# Patient Record
Sex: Male | Born: 1965 | Race: Black or African American | Hispanic: No | Marital: Married | Smoking: Never smoker
Health system: Southern US, Community
[De-identification: ages and names within clinical notes are randomized; demographics above are authoritative.]

## PROBLEM LIST (undated history)

## (undated) DIAGNOSIS — B1910 Unspecified viral hepatitis B without hepatic coma: Secondary | ICD-10-CM

## (undated) HISTORY — DX: Unspecified viral hepatitis B without hepatic coma: B19.10

---

## 1997-12-11 DIAGNOSIS — B1910 Unspecified viral hepatitis B without hepatic coma: Secondary | ICD-10-CM

## 1997-12-11 HISTORY — DX: Unspecified viral hepatitis B without hepatic coma: B19.10

## 2001-12-11 HISTORY — PX: HERNIA REPAIR: SHX51

## 2006-08-21 ENCOUNTER — Ambulatory Visit: Payer: Self-pay | Admitting: Sports Medicine

## 2006-08-29 ENCOUNTER — Ambulatory Visit: Payer: Self-pay | Admitting: Sports Medicine

## 2006-08-29 LAB — CONVERTED CEMR LAB
Creatinine, Ser: 1.06 mg/dL
HDL: 68 mg/dL
LDL Cholesterol: 145 mg/dL
LDL Cholesterol: 145 mg/dL
Potassium: 4.9 meq/L
Potassium: 4.9 meq/L

## 2006-09-24 ENCOUNTER — Ambulatory Visit: Payer: Self-pay | Admitting: Family Medicine

## 2006-10-17 ENCOUNTER — Encounter: Admission: RE | Admit: 2006-10-17 | Discharge: 2006-10-17 | Payer: Self-pay | Admitting: Gastroenterology

## 2006-10-24 ENCOUNTER — Ambulatory Visit: Payer: Self-pay | Admitting: Sports Medicine

## 2006-12-28 ENCOUNTER — Encounter: Admission: RE | Admit: 2006-12-28 | Discharge: 2006-12-28 | Payer: Self-pay | Admitting: Gastroenterology

## 2007-01-30 ENCOUNTER — Encounter (INDEPENDENT_AMBULATORY_CARE_PROVIDER_SITE_OTHER): Payer: Self-pay | Admitting: *Deleted

## 2007-02-07 DIAGNOSIS — B1910 Unspecified viral hepatitis B without hepatic coma: Secondary | ICD-10-CM | POA: Insufficient documentation

## 2008-05-18 ENCOUNTER — Ambulatory Visit: Payer: Self-pay | Admitting: Family Medicine

## 2008-05-18 ENCOUNTER — Encounter (INDEPENDENT_AMBULATORY_CARE_PROVIDER_SITE_OTHER): Payer: Self-pay | Admitting: *Deleted

## 2008-05-22 ENCOUNTER — Encounter (INDEPENDENT_AMBULATORY_CARE_PROVIDER_SITE_OTHER): Payer: Self-pay | Admitting: *Deleted

## 2008-05-22 LAB — CONVERTED CEMR LAB
AST: 17 units/L (ref 0–37)
Alkaline Phosphatase: 54 units/L (ref 39–117)
BUN: 15 mg/dL (ref 6–23)
Calcium: 9.9 mg/dL (ref 8.4–10.5)
Chloride: 104 meq/L (ref 96–112)
Creatinine, Ser: 1.15 mg/dL (ref 0.40–1.50)
HDL: 87 mg/dL (ref >39)
TSH: 1.1 microintl units/mL (ref 0.350–5.50)
Total Bilirubin: 1.1 mg/dL (ref 0.3–1.2)
Total CHOL/HDL Ratio: 2.2
VLDL: 7 mg/dL (ref 0–40)

## 2008-06-01 ENCOUNTER — Encounter (INDEPENDENT_AMBULATORY_CARE_PROVIDER_SITE_OTHER): Payer: Self-pay | Admitting: *Deleted

## 2008-06-01 ENCOUNTER — Ambulatory Visit (HOSPITAL_COMMUNITY): Admission: RE | Admit: 2008-06-01 | Discharge: 2008-06-01 | Payer: Self-pay | Admitting: *Deleted

## 2008-07-02 ENCOUNTER — Telehealth: Payer: Self-pay | Admitting: *Deleted

## 2008-07-02 ENCOUNTER — Ambulatory Visit: Payer: Self-pay | Admitting: Sports Medicine

## 2008-07-16 ENCOUNTER — Ambulatory Visit: Payer: Self-pay | Admitting: Family Medicine

## 2008-07-22 ENCOUNTER — Ambulatory Visit: Payer: Self-pay | Admitting: Family Medicine

## 2008-07-28 ENCOUNTER — Encounter: Payer: Self-pay | Admitting: *Deleted

## 2008-08-03 ENCOUNTER — Ambulatory Visit: Payer: Self-pay | Admitting: Sports Medicine

## 2008-08-04 ENCOUNTER — Telehealth (INDEPENDENT_AMBULATORY_CARE_PROVIDER_SITE_OTHER): Payer: Self-pay | Admitting: *Deleted

## 2009-10-21 ENCOUNTER — Ambulatory Visit: Payer: Self-pay | Admitting: Family Medicine

## 2009-10-21 ENCOUNTER — Encounter: Payer: Self-pay | Admitting: Sports Medicine

## 2009-10-25 ENCOUNTER — Ambulatory Visit (HOSPITAL_COMMUNITY): Admission: RE | Admit: 2009-10-25 | Discharge: 2009-10-25 | Payer: Self-pay | Admitting: Sports Medicine

## 2009-10-25 LAB — CONVERTED CEMR LAB
AFP-Tumor Marker: 7 ng/mL (ref 0.0–8.0)
ALT: 19 units/L (ref 0–53)
AST: 22 units/L (ref 0–37)
Albumin: 4.3 g/dL (ref 3.5–5.2)
Alkaline Phosphatase: 60 units/L (ref 39–117)
BUN: 14 mg/dL (ref 6–23)
CO2: 28 meq/L (ref 19–32)
Calcium: 9.8 mg/dL (ref 8.4–10.5)
Chloride: 104 meq/L (ref 96–112)
Creatinine, Ser: 1.18 mg/dL (ref 0.40–1.50)
Glucose, Bld: 84 mg/dL (ref 70–99)
Hepatitis B DNA (Calc): 19322 copies/mL — ABNORMAL HIGH (ref <169)
Hepatitis B DNA: 3320 IU/mL — ABNORMAL HIGH (ref <29)
Potassium: 4.6 meq/L (ref 3.5–5.3)
Sodium: 142 meq/L (ref 135–145)
Total Bilirubin: 1 mg/dL (ref 0.3–1.2)
Total Protein: 6.8 g/dL (ref 6.0–8.3)

## 2009-12-13 IMAGING — US US ABDOMEN COMPLETE
1 series · 14 of 25 positions shown · non-contrast
Comparison: 05/19/2008.

CLINICAL DATA: Hepatitis C - follow-up

ABDOMEN ULTRASOUND
TECHNIQUE: Routine.

[Series 1: us abdomen complete · 0.31mm/px · 14 of 58 slices shown]
[im 1/58]
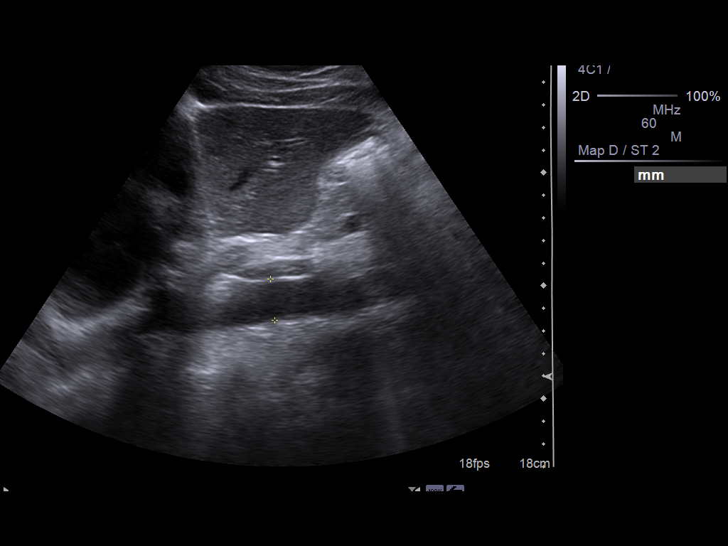
[im 5/58]
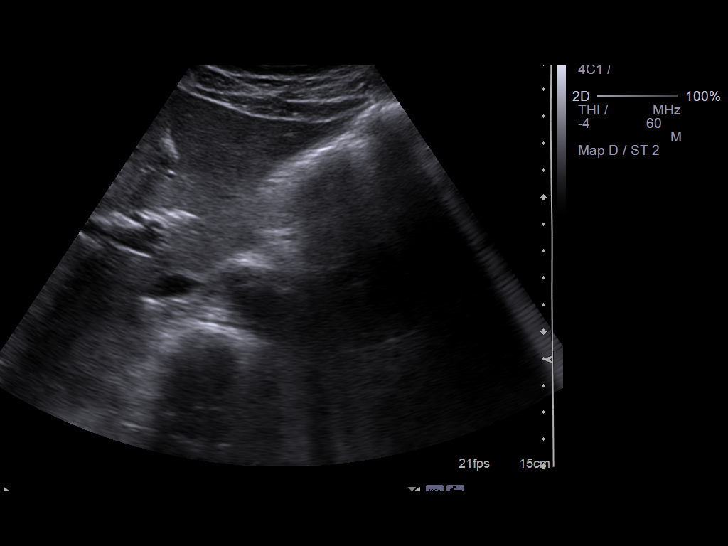
[im 10/58]
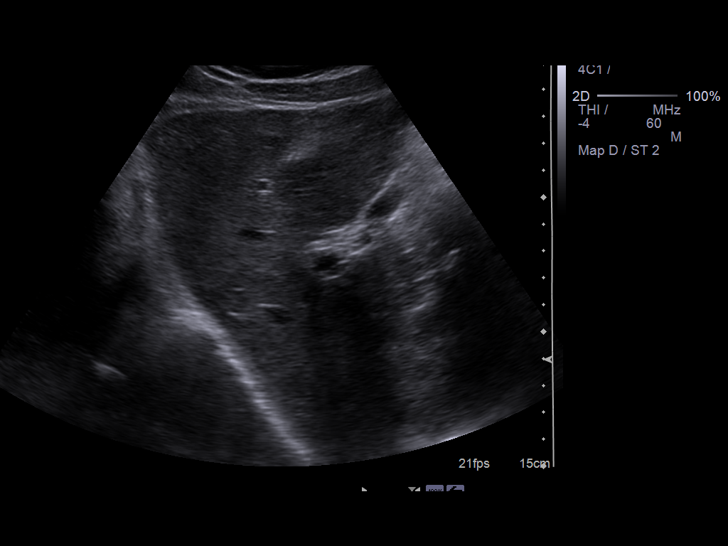
[im 15/58]
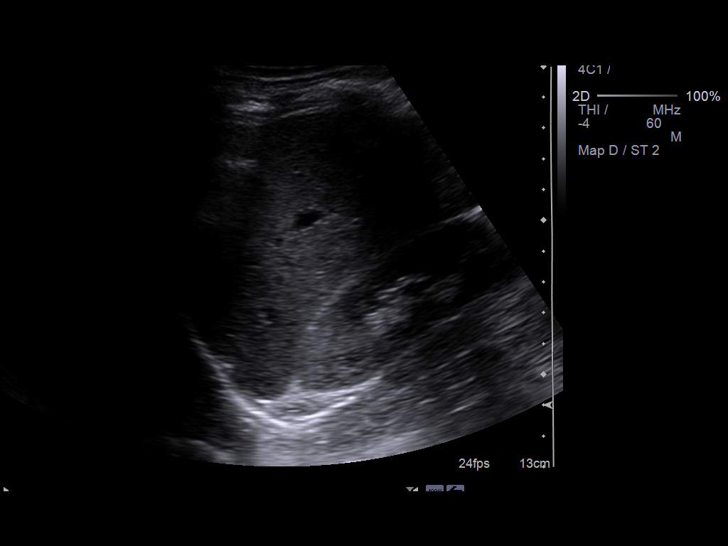
[im 20/58]
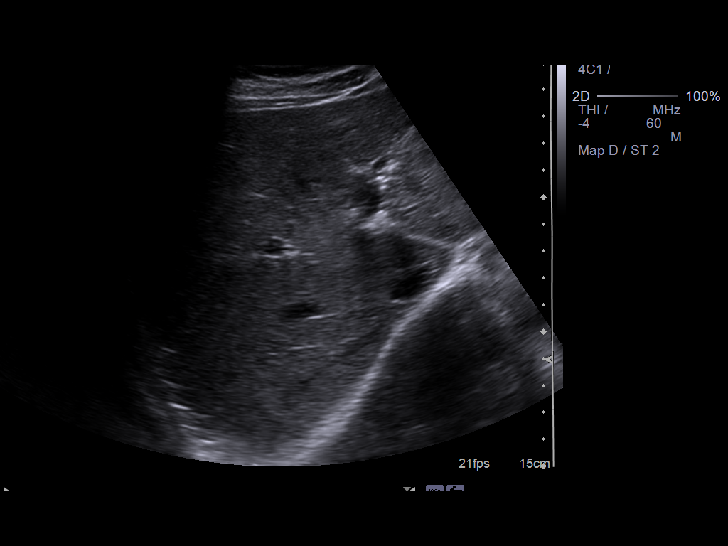
[im 22/58]
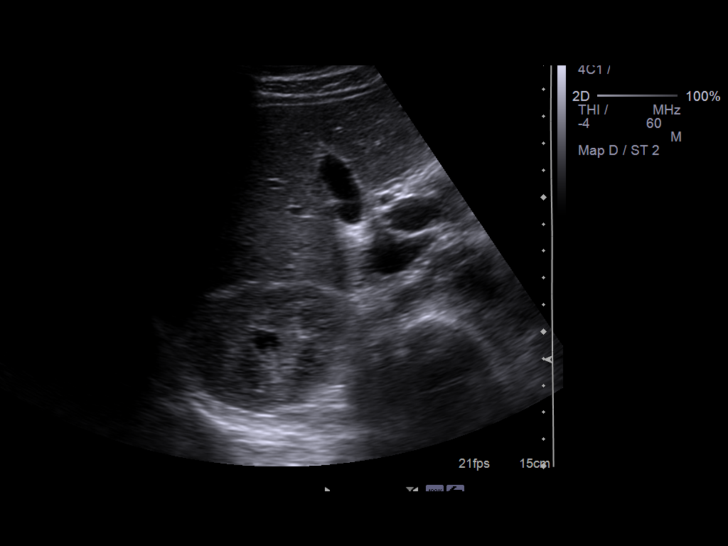
[im 27/58]
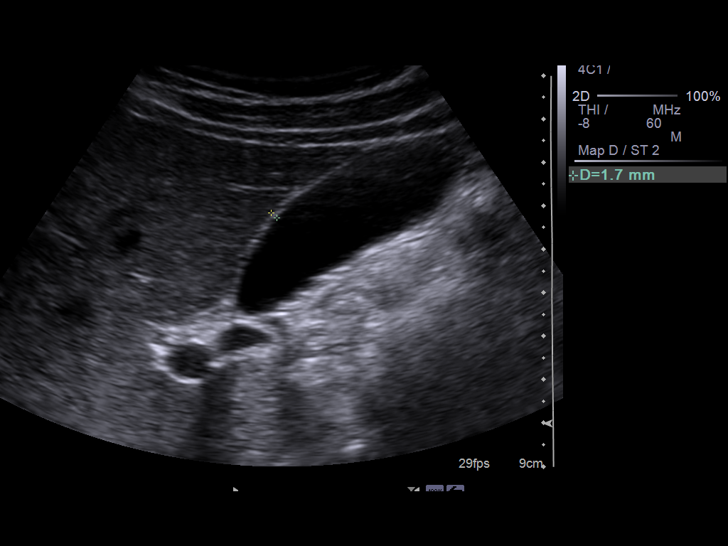
[im 31/58]
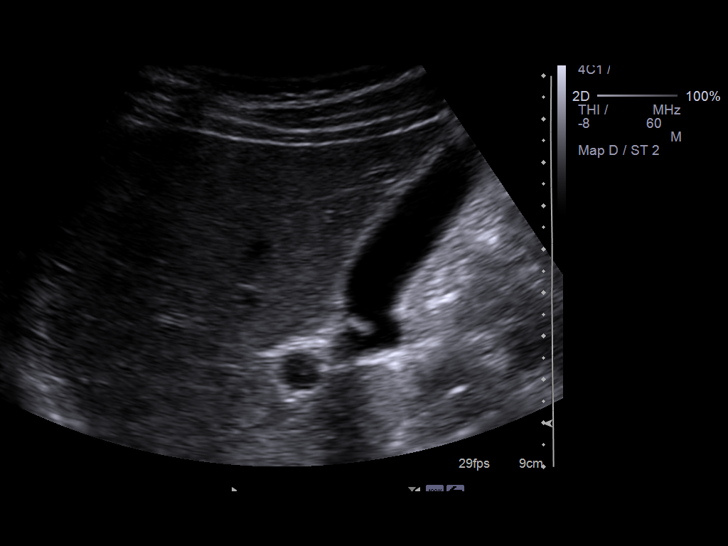
[im 36/58]
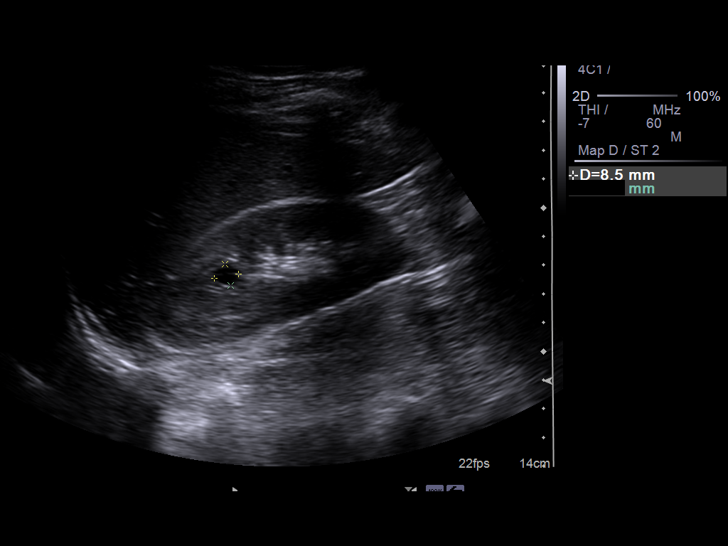
[im 39/58]
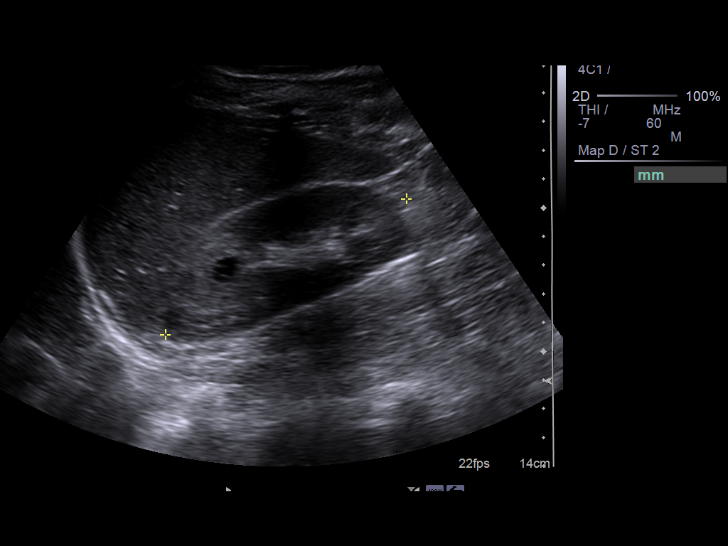
[im 43/58]
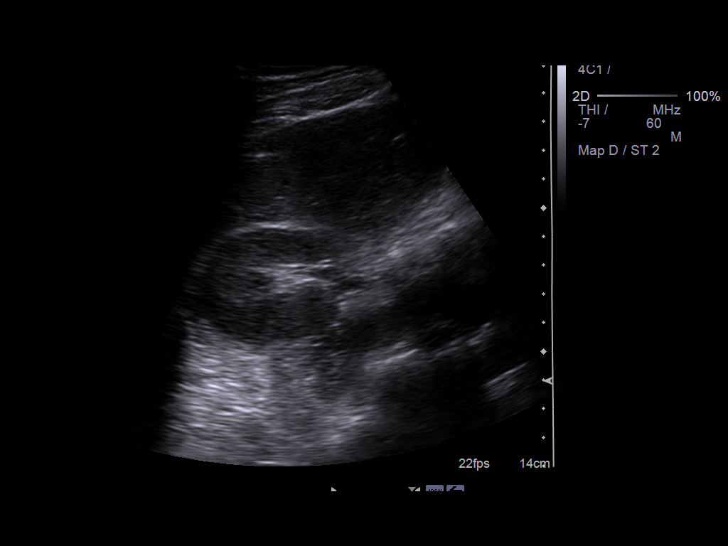
[im 48/58]
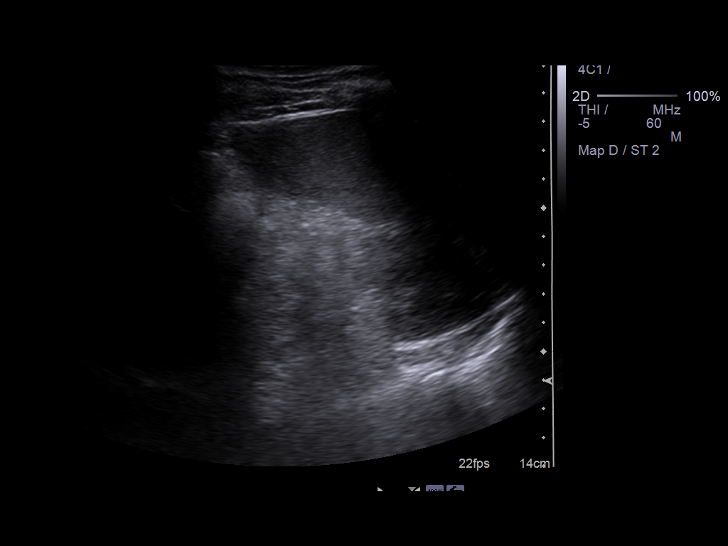
[im 53/58]
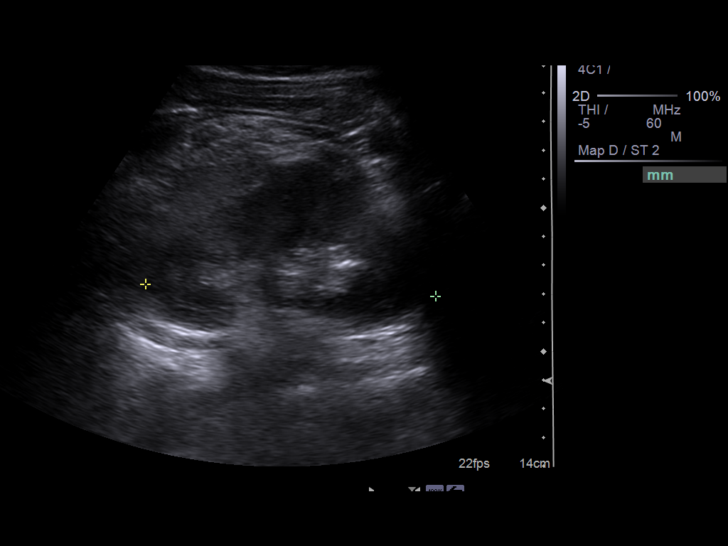
[im 58/58]
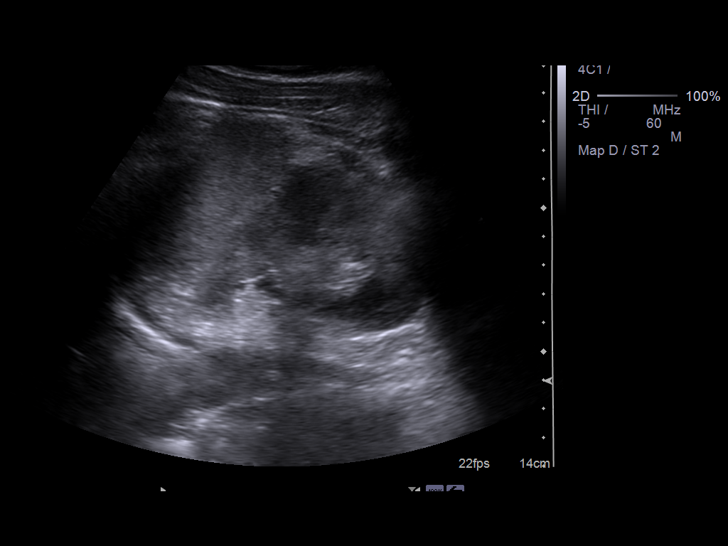

[14 of 25 positions shown; findings below may reference images not displayed]

FINDINGS: Gallbladder and biliary tree remain normal.  Common duct
2.3 mm.

There are no focal lesions of the liver.  The echotexture appears
homogeneous.  Spleen normal.  Pancreas obscured by overlying bowel
gas.  Renal size normal bilaterally with a stable 9 mm simple cyst
of the right kidney.  IVC and aorta normal.  No ascites.
IMPRESSION: No pathological findings - the pancreas is not adequately
evaluated.

## 2009-12-17 ENCOUNTER — Telehealth: Payer: Self-pay | Admitting: Sports Medicine

## 2010-01-24 ENCOUNTER — Encounter: Payer: Self-pay | Admitting: Sports Medicine

## 2010-01-24 ENCOUNTER — Ambulatory Visit: Payer: Self-pay | Admitting: Family Medicine

## 2010-01-28 LAB — CONVERTED CEMR LAB
AFP-Tumor Marker: 6.3 ng/mL (ref 0.0–8.0)
ALT: 21 units/L (ref 0–53)
AST: 21 units/L (ref 0–37)
Albumin: 4.4 g/dL (ref 3.5–5.2)
Alkaline Phosphatase: 60 U/L (ref 39–117)
BUN: 16 mg/dL (ref 6–23)
CO2: 28 meq/L (ref 19–32)
Calcium: 9.8 mg/dL (ref 8.4–10.5)
Chloride: 103 meq/L (ref 96–112)
Creatinine, Ser: 1.25 mg/dL (ref 0.40–1.50)
Glucose, Bld: 100 mg/dL — ABNORMAL HIGH (ref 70–99)
Hepatitis B DNA (Calc): 4301 {copies}/mL — ABNORMAL HIGH (ref <169)
Hepatitis B DNA: 739 [IU]/mL — ABNORMAL HIGH (ref <29)
Potassium: 4.7 meq/L (ref 3.5–5.3)
Sodium: 140 meq/L (ref 135–145)
Total Bilirubin: 1 mg/dL (ref 0.3–1.2)
Total Protein: 6.7 g/dL (ref 6.0–8.3)

## 2010-06-14 ENCOUNTER — Telehealth: Payer: Self-pay | Admitting: Family Medicine

## 2010-06-14 ENCOUNTER — Ambulatory Visit: Payer: Self-pay | Admitting: Family Medicine

## 2010-12-22 ENCOUNTER — Encounter: Payer: Self-pay | Admitting: Sports Medicine

## 2010-12-22 ENCOUNTER — Ambulatory Visit: Admission: RE | Admit: 2010-12-22 | Discharge: 2010-12-22 | Payer: Self-pay | Source: Home / Self Care

## 2010-12-28 LAB — CONVERTED CEMR LAB
AFP-Tumor Marker: 6 ng/mL (ref 0.0–8.0)
ALT: 19 U/L (ref 0–53)
AST: 20 U/L (ref 0–37)
Albumin: 4.5 g/dL (ref 3.5–5.2)
Alkaline Phosphatase: 56 U/L (ref 39–117)
BUN: 15 mg/dL (ref 6–23)
CO2: 27 meq/L (ref 19–32)
Calcium: 9.5 mg/dL (ref 8.4–10.5)
Chloride: 103 meq/L (ref 96–112)
Creatinine, Ser: 1.09 mg/dL (ref 0.40–1.50)
Glucose, Bld: 94 mg/dL (ref 70–99)
Hepatitis B DNA (Calc): 11291 {copies}/mL — ABNORMAL HIGH (ref <116)
Hepatitis B DNA: 1940 IU/mL — ABNORMAL HIGH (ref <20)
Potassium: 4.5 meq/L (ref 3.5–5.3)
Sodium: 139 meq/L (ref 135–145)
Total Bilirubin: 1.1 mg/dL (ref 0.3–1.2)
Total Protein: 7 g/dL (ref 6.0–8.3)

## 2011-01-09 ENCOUNTER — Ambulatory Visit (HOSPITAL_COMMUNITY)
Admission: RE | Admit: 2011-01-09 | Discharge: 2011-01-09 | Payer: Self-pay | Source: Home / Self Care | Attending: Sports Medicine | Admitting: Sports Medicine

## 2011-01-10 NOTE — Assessment & Plan Note (Signed)
 Summary: physical/kh   Vital Signs:  Patient profile:   45 year old male Height:      67 inches Weight:      155 pounds BMI:     24.36 BSA:     1.82 Temp:     97.5 degrees F Pulse rate:   58 / minute BP sitting:   123 / 91  Vitals Entered By: Harlene Carte CMA (October 21, 2009 9:43 AM)  Serial Vital Signs/Assessments:  Time      Position  BP       Pulse  Resp  Temp     By                     873/11                         Debby Petties MD  CC: physical Is Patient Diabetic? No Pain Assessment Patient in pain? no        Primary Care Provider:  Debby Petties MD  CC:  physical.  History of Present Illness: 40y M with HTN, Hep B here for physical and meet new MD.  HTN:  Diet controlled.  Hep B:  Dr. Rollin is Hepatologist, we usually check Hep B quant DNA, AFP, CMET, Liver US  and forward results to [facility].  No complaints.  Habits & Providers  Alcohol-Tobacco-Diet     Tobacco Status: never  Past History:  Past Medical History: Last updated: Jun 09, 2008 Corns Globus/reflux HTN - BP off meds 05/2008 WNL Poor Vision,  Hep B well controlled and s/p antiviral therapy with zero viral load -abd U/S:  left renal cyst, nl liver - 10/19/2006  Past Surgical History: Last updated: 02/07/2007 abd U/S:  left renal cyst, nl liver - 10/19/2006  Family History: Last updated: 09-Jun-2008 F: DM, Died at 47.  NO prostate cancer M: Liver Cancer 2o to hepatitis? Died in her 34s  Social History: Last updated: 06/09/08 Lives in [place].  Cousin to Ibrahim Ndoye.  From [place].  Lives with wife and 2 daughters.  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  Eyes:  No corneal or conjunctival inflammation noted.  EOMI. Perrla.  Ears:  External ear exam shows no significant lesions or deformities.  Otoscopic examination reveals clear canal, tympanic membrane is intact on left without bulging, retraction, inflammation or discharge. Hearing is grossly normal bilaterally.    TM on right has what appears to be an old perforation. Nose:  External nasal examination shows no deformity or inflammation. Nasal mucosa are pink and moist without lesions or exudates. Mouth:  Oral mucosa and oropharynx without lesions or exudates.  Teeth in good repair. Neck:  No deformities, masses, or tenderness noted. Lungs:  Normal respiratory effort, chest expands symmetrically. Lungs are clear to auscultation, no crackles or wheezes. Heart:  Normal rate and regular rhythm. S1 and S2 normal without gallop, murmur, click, rub or other extra sounds. Abdomen:  Bowel sounds positive,abdomen soft and non-tender without masses, organomegaly or hernias noted.  Liver span about 10cm. Msk:  No deformity or scoliosis noted of thoracic or lumbar spine.   Pulses:  R and L carotid,radial,femoral,dorsalis pedis and posterior tibial pulses are full and equal bilaterally Extremities:  No clubbing, cyanosis,  edema, or deformity noted with normal full range of motion of all joints.   Neurologic:  No cranial nerve deficits noted. Station and gait are normal. Plantar reflexes are down-going bilaterally. DTRs are symmetrical throughout. Sensory, motor and coordinative functions appear intact. Skin:  Intact without suspicious lesions or rashes   Impression & Recommendations:  Problem # 1:  OTITIS MEDIA, RIGHT, WITH RUPTURE OF TYMPANIC MEMBRANE (ICD-382.01) Assessment Improved Appears resolved, hearing intact.  Pt never saw ENT 2/2 cost.  The following medications were removed from the medication list:    Azithromycin 500 Mg Tabs (Azithromycin) .SABRA... 1 tab by mouth daily x 5 days  Problem # 2:  HYPERTENSION, BENIGN SYSTEMIC (ICD-401.1) Assessment:  Improved Diet controlled. Normal on my recheck.  Orders: Comp Met-[facility] ([id number]) [facility]- Est Level  3 (00786)  Problem # 3:  HEPATITIS B (ICD-070.30) Assessment: Unchanged Checking AFP, Hep B quant DNA, Liver US , CMET, will forward results to Dr. Rollin.  Orders: Comp Met-[facility] [phone]) Miscellaneous Lab Charge-[facility] [id number]) [facility]- Est Level  3 (00786) Ultrasound (Ultrasound)  Problem # 4:  Preventive Health Care (ICD-V70.0) Assessment: Comment Only Normal Well adult exam. Flu shot today, tetanus shot UTD.  Other Orders: Influenza Vaccine NON MCR (99971)  Patient Instructions: 1)  Great to meet you today, 2)  Hep B quant DNA, AFP, Complete Metabolic Panel 3)  Liver Ultrasound. Will forward results to Dr. Rollin 4)  Flu and tetanus shot. 5)  Come back to see me in one year or sooner if needed. 6)  -Dr. ONEIDA.  Last HDL:  87 (05/18/2008 6:05:00 PM) HDL Next Due:  Not Indicated Last LDL:  98 (05/18/2008 6:05:00 PM) LDL Next Due:  Not Indicated   Immunizations Administered:  Influenza Vaccine # 1:    Vaccine Type: Fluvax Non-MCR    Site: left deltoid    Mfr: GlaxoSmithKline    Dose: 0.5 ml    Route: IM    Given by: Harlene Carte CMA    Exp. Date: 06/09/2010    Lot #: [id number]    VIS given: 8.10.10  Flu Vaccine Consent Questions:    Do you have a history of severe allergic reactions to this vaccine? no    Any prior history of allergic reactions to egg and/or gelatin? no    Do you have a sensitivity to the preservative Thimersol? no    Do you have a past history of Guillan-Barre Syndrome? no    Do you currently have an acute febrile illness? no    Have you ever had a severe reaction to latex? no    Vaccine information given and explained to patient? yes   Prevention & Chronic Care Immunizations   Influenza vaccine: Fluvax Non-MCR  (10/21/2009)    Tetanus booster: 06/10/2006: Done.   Tetanus booster due: 06/10/2016    Pneumococcal vaccine: Not  documented  Other Screening   PSA: Not documented   Smoking status: never  (10/21/2009)  Lipids   Total Cholesterol: 192  (05/18/2008)   LDL: 98  (05/18/2008)   LDL Direct: Not documented   HDL: 87  (05/18/2008)   Triglycerides: 36  (05/18/2008)  Hypertension   Last Blood Pressure: 123 / 91  (10/21/2009)   Serum creatinine: 1.15  (05/18/2008)   Serum potassium 4.6  (05/18/2008) CMP ordered     Hypertension flowsheet reviewed?: Yes   Progress toward BP goal: At goal  Self-Management Support :   Personal Goals (by the next clinic visit) :  Personal blood pressure goal: 140/90  (10/21/2009)   Hypertension self-management support: Not documented

## 2011-01-10 NOTE — Assessment & Plan Note (Signed)
Summary: low back pain x 10 days/[place]/T   Vital Signs:  Patient profile:   45 year old male Height:      67 inches Weight:      158.8 pounds BMI:     24.96 Temp:     97.8 degrees F oral Pulse rate:   59 / minute BP sitting:   132 / 83  (left arm) Cuff size:   regular  Vitals Entered By: Garen Grams LPN (June 14, 4781 11:13 AM) CC: low back pain x 10 days Is Patient Diabetic? No Pain Assessment Patient in pain? yes     Location: back Intensity: 6   Primary Care Provider:  Rodney Langton MD  CC:  low back pain x 10 days.  History of Present Illness: 1. Low back pain:  Has been there for 10 days.  Located on the right side of his low back.  Worse with movement or after sitting for a long time.  He has a hx of low back pain that normally get better after a couple of days.  He is concerned because this time it has not gone away.  Described as a "tight, achey" pain.  Advil has not been helping.  Rest seems to help it a little bit.  ROS: denies numbness / weakness, loss of bowel / bladder function, shooting pain down the legs.  SocHx:  Works as a Web designer     Tobacco Status: never  Past History:  Past Medical History: Reviewed history from 05/18/2008 and no changes required. Corns Globus/reflux HTN - BP off meds 05/2008 WNL Poor Vision,  Hep B well controlled and s/p antiviral therapy with zero viral load -abd U/S:  left renal cyst, nl liver - 10/19/2006  Social History: Reviewed history from 05/18/2008 and no changes required. Lives in [place].  Cousin to [employer].  From [place].  Lives with wife and 2 daughters.  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   Lungs:  Normal respiratory effort, chest expands symmetrically. Lungs are  clear to auscultation, no crackles or wheezes. Heart:  Normal rate and regular rhythm. S1 and S2 normal without gallop, murmur, click, rub or other extra sounds. Abdomen:  soft, non-tender, and normal bowel sounds.   Msk:  No deformity or scoliosis noted of thoracic or lumbar spine.  TTP at right sacroiliac joint and along paraspinal muscles on the right.  Paraspinal muscles appear to be larger on the right than on the left.  Full ROM but slowed because of pain.  Negative SLR test bilaterally. Extremities:  No clubbing, cyanosis, edema, or deformity noted with normal full range of motion of all joints.   Skin:  Intact without suspicious lesions or rashes Psych:  not anxious appearing and not depressed appearing.     Impression & Recommendations:  Problem # 1:  BACK PAIN, LUMBAR (ICD-724.2) Assessment New  Seems most c/w muscle spasm +/- low back strain.  Will treat with Flexeril and Ibuprofen.  Precautions and red flags reviewed. His updated medication list for this problem includes:    Flexeril 5 Mg Tabs (Cyclobenzaprine hcl) .Marland Kitchen... Take 1 tab by mouth three times a day as needed for muscle pain    Ibuprofen 800 Mg Tabs (Ibuprofen) .Marland Kitchen... Take 1 tab three times a day as needed for pain  Orders:  [facility]- Est Level  3 (99213)  Complete Medication List: 1)  Flexeril 5 Mg Tabs (Cyclobenzaprine hcl) .... Take 1 tab by mouth three times a day as needed for muscle pain 2)  Ibuprofen 800 Mg Tabs (Ibuprofen) .... Take 1 tab three times a day as needed for pain  Patient Instructions: 1)  I think that you are having a muscle spasm 2)  I have prescribed a muscle relaxant 3)  This should make it feel better in the next couple of days 4)  Continue to do the back stretching exercises 5)  This may take a couple of weeks to get better 6)  If the pain gets worse please return to clinic Prescriptions: IBUPROFEN 800 MG TABS (IBUPROFEN) Take 1 tab three times a day as needed for pain  #40 x 0   Entered and  Authorized by:   Angelena Sole MD   Signed by:   Angelena Sole MD on 06/14/2010   Method used:   Print then Give to Patient   RxID:   [id number] FLEXERIL 5 MG TABS (CYCLOBENZAPRINE HCL) Take 1 tab by mouth three times a day as needed for muscle pain  #40 x 0   Entered and Authorized by:   Angelena Sole MD   Signed by:   Angelena Sole MD on 06/14/2010   Method used:   Print then Give to Patient   RxID:   [id number]

## 2011-01-10 NOTE — Progress Notes (Signed)
Summary: phn msg  Phone Note Call from Patient   Caller: Patient Summary of Call: His GI doctor wants him to have a Hepatitis B function done through [place].  Is that  o.k. Initial call taken by: Clydell Hakim,  December 17, 2009 2:15 PM  Follow-up for Phone Call        Forward to PCP Follow-up by: Gladstone Pih,  December 20, 2009 11:50 AM  Additional Follow-up for Phone Call Additional follow up Details #1::        Yes thats fine, when does he need this bloodwork done? Additional Follow-up by: Rodney Langton MD,  December 20, 2009 2:34 PM    Additional Follow-up for Phone Call Additional follow up Details #2::    Called pt [phone]  stated he needs the lab work done on the 14 of Feb and has made a lab appt. Follow-up by: Gladstone Pih,  December 20, 2009 4:50 PM  Additional Follow-up for Phone Call Additional follow up Details #3:: Details for Additional Follow-up Action Taken: Wow, lab appt made already!  Future orders for CMET, Hep B quantitative DNA, and AFP put in centricity. Thanks. Additional Follow-up by: Rodney Langton MD,  December 21, 2009 9:56 AM

## 2011-01-10 NOTE — Progress Notes (Signed)
Summary: triage  Phone Note Call from Patient Call back at Home Phone [phone]   Caller: Patient Summary of Call: Very low back pain.  Can he be seen today? Initial call taken by: Clydell Hakim,  June 14, 2010 9:26 AM  Follow-up for Phone Call        started 10 days ago. using advil. denies injury. hx of back pain. states it usually goes away after 3-4 days of rest & advil. concerned that it is still going on. to see Dr. Lelon Perla at 11am as he had a cancellation at this time Follow-up by: Golden Circle RN,  June 14, 2010 9:48 AM

## 2011-01-12 NOTE — Assessment & Plan Note (Signed)
Summary: routine visit/eo   Vital Signs:  Patient profile:   45 year old male Height:      67 inches Weight:      158.2 pounds BMI:     24.87 Temp:     98.2 degrees F oral Pulse rate:   60 / minute BP sitting:   127 / 88  (left arm) Cuff size:   regular  Vitals Entered By: Jimmy Footman, CMA (December 22, 2010 10:48 AM) CC: follow-up visit Is Patient Diabetic? No Pain Assessment Patient in pain? no        Primary Care Provider:  Rodney Langton MD  CC:  follow-up visit.  History of Present Illness: 38y M with HTN, Hep B here for fu.  HTN:  Diet controlled.  Hep B:  Dr. Elnoria Howard is Hepatologist, we usually check Hep B quant DNA, AFP, CMET, Liver US and forward results to [facility].  Pt has not seen Dr. Elnoria Howard in over a year due to no money.  Pt usually just communicates to Dr. Elnoria Howard by phone.  No complaints.  Habits & Providers  Alcohol-Tobacco-Diet     Tobacco Status: never  Current Medications (verified): 1)  Flexeril 5 Mg Tabs (Cyclobenzaprine Hcl) .... Take 1 Tab By Mouth Three Times A Day As Needed For Muscle Pain 2)  Ibuprofen 800 Mg Tabs (Ibuprofen) .... Take 1 Tab Three Times A Day As Needed For Pain  Allergies (verified): No Known Drug Allergies  Review of Systems       See HPI  Physical Exam  General:  Well-developed,well-nourished,in no acute distress; alert,appropriate and cooperative throughout examination Head:  Normocephalic and atraumatic without obvious abnormalities. No apparent alopecia or balding. Eyes:  No corneal or conjunctival inflammation noted. EOMI. Perrl Ears:  External ear exam shows no significant lesions or deformities.  Otoscopic examination reveals B/L cerumen impactions which were removed. Nose:  External nasal examination shows no deformity or inflammation. Nasal mucosa are pink and moist without lesions or exudates. Mouth:  Oral mucosa and oropharynx without lesions or exudates.   Neck:  No deformities, masses, or tenderness  noted. Lungs:  Normal respiratory effort, chest expands symmetrically. Lungs are clear to auscultation, no crackles or wheezes. Heart:  Normal rate and regular rhythm. S1 and S2 normal without gallop, murmur, click, rub or other extra sounds. Abdomen:  Bowel sounds positive,abdomen soft and non-tender without masses, organomegaly or hernias noted.  Liver spac 10-12cm. Extremities:  No swelling or edema. Neurologic:  Grossly non-focal.   Impression & Recommendations:  Problem # 1:  HEPATITIS B (ICD-070.30) Assessment Unchanged Labs checked. Will forward labs and [place] to Dr. Elnoria Howard.  Orders: [facility]- Est Level  3 (99213) Comp Met-[facility] ([id number]) Miscellaneous Lab Charge-[facility] (09811) Ultrasound (Ultrasound)  Problem # 2:  CERUMEN IMPACTION, BILATERAL (ICD-380.4) Assessment: New Removed.  Orders: [facility]- Est Level  3 ([id number]) Cerumen Impaction Removal-[facility] (29562)  Problem # 3:  HYPERTENSION Assessment: Improved Diet controlled, no changes, will remove from problem list.  Complete Medication List: 1)  Flexeril 5 Mg Tabs (Cyclobenzaprine hcl) .... Take 1 tab by mouth three times a day as needed for muscle pain 2)  Ibuprofen 800 Mg Tabs (Ibuprofen) .... Take 1 tab three times a day as needed for pain  Other Orders: Influenza Vaccine NON MCR (13086)   Orders Added: 1)  [facility]- Est Level  3 [99213] 2)  Comp Met-[facility] [[id number]] 3)  Miscellaneous Lab Charge-[facility] [99999] 4)  Ultrasound [Ultrasound] 5)  Cerumen Impaction Removal-[facility] [57846] 6)  Influenza  Vaccine NON MCR [00028]   Immunizations Administered:  Influenza Vaccine # 1:    Vaccine Type: Fluvax Non-MCR    Site: left deltoid    Mfr: GlaxoSmithKline    Dose: 0.5 ml    Route: IM    Given by: Loralee Pacas CMA    Exp. Date: 06/10/2011    Lot #: [id number]    VIS given: 07/05/10 version given December 22, 2010.  Flu Vaccine Consent Questions:    Do you have a history of severe allergic reactions to this vaccine? no    Any  prior history of allergic reactions to egg and/or gelatin? no    Do you have a sensitivity to the preservative Thimersol? no    Do you have a past history of Guillan-Barre Syndrome? no    Do you currently have an acute febrile illness? no    Have you ever had a severe reaction to latex? no    Vaccine information given and explained to patient? yes   Immunizations Administered:  Influenza Vaccine # 1:    Vaccine Type: Fluvax Non-MCR    Site: left deltoid    Mfr: GlaxoSmithKline    Dose: 0.5 ml    Route: IM    Given by: Loralee Pacas CMA    Exp. Date: 06/10/2011    Lot #: [id number]    VIS given: 07/05/10 version given December 22, 2010.  Last Flu Vaccine:  Fluvax Non-MCR (10/21/2009 9:17:45 AM) Flu Vaccine Result Date:  12/22/2010 Flu Vaccine Result:  given Flu Vaccine Next Due:  1 yr

## 2011-02-27 IMAGING — US US ABDOMEN COMPLETE
1 series · 13 of 25 positions shown · non-contrast
Comparison: 10/25/2009.  CT 12/28/2008.

CLINICAL DATA: History of hepatitis B.

ABDOMINAL ULTRASOUND COMPLETE

[Series 1: us abdomen complete · 0.30mm/px · 13 of 73 slices shown]
[im 1/73]
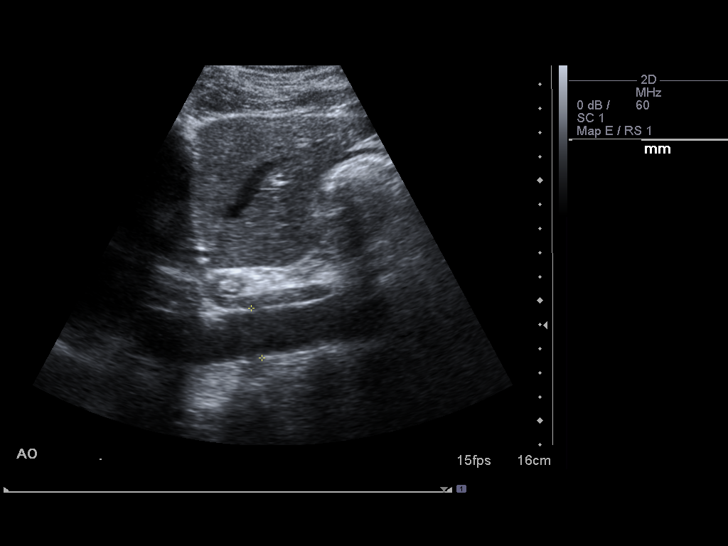
[im 7/73]
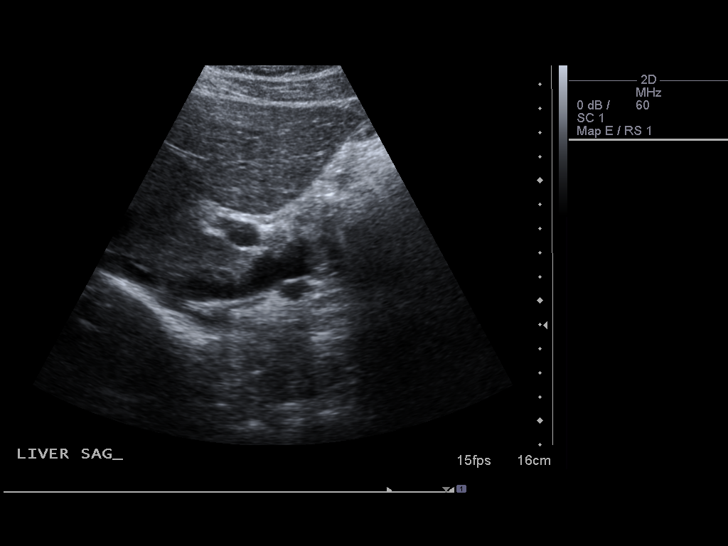
[im 13/73]
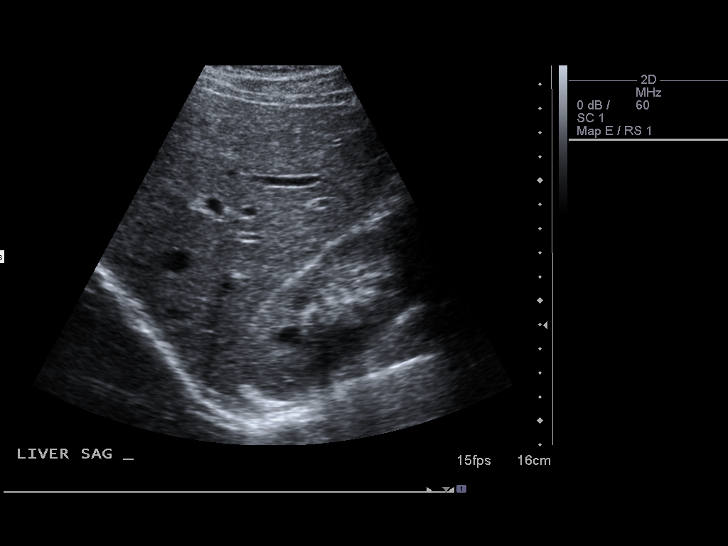
[im 19/73]
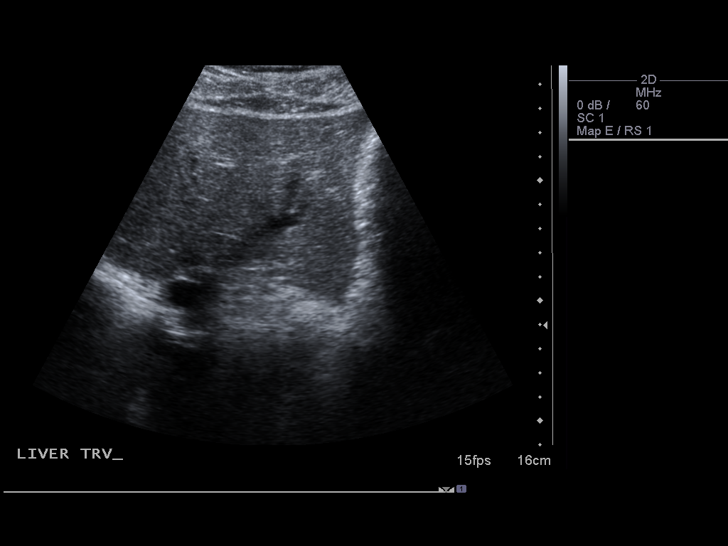
[im 25/73]
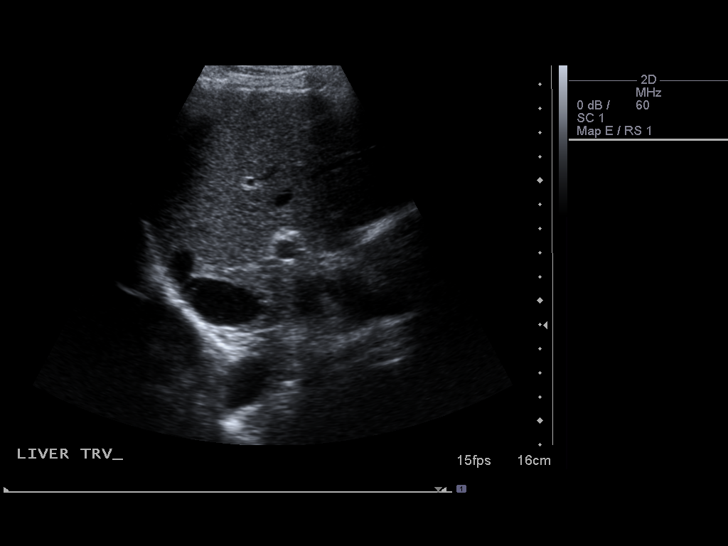
[im 31/73]
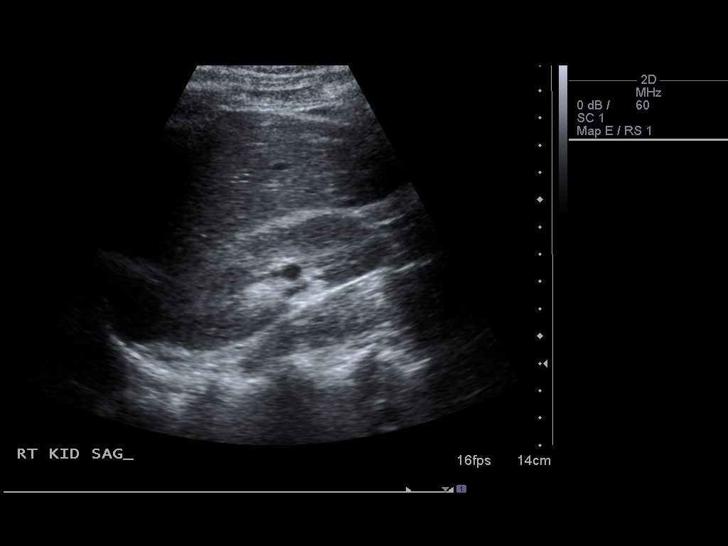
[im 37/73]
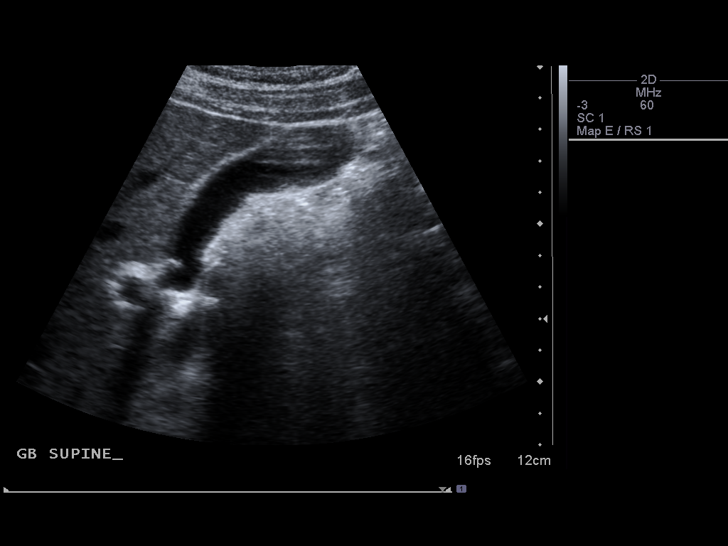
[im 43/73]
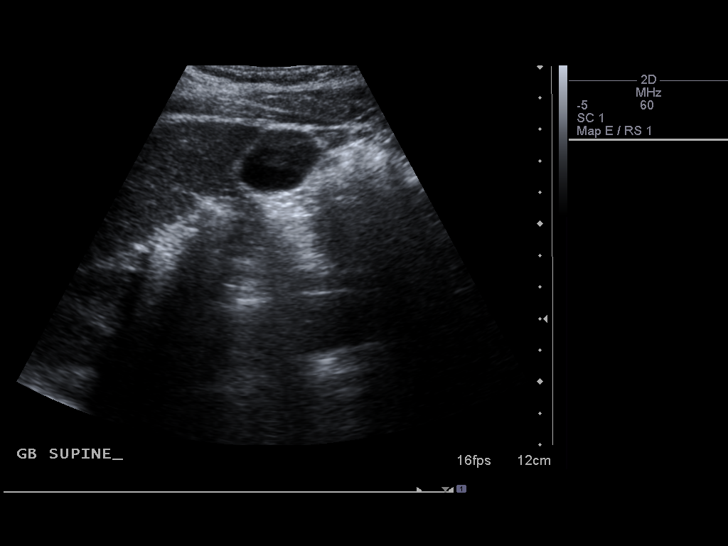
[im 49/73]
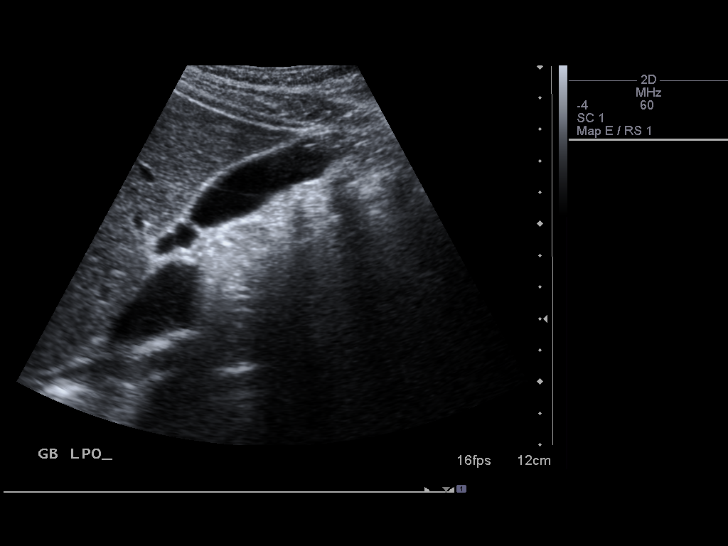
[im 55/73]
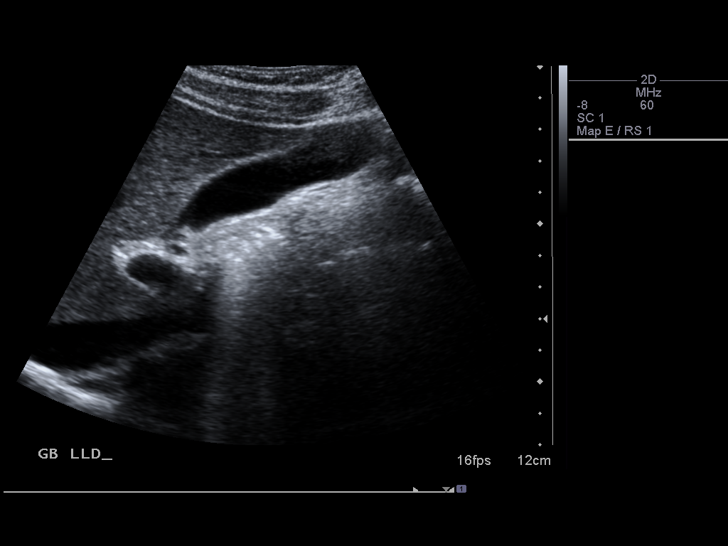
[im 61/73]
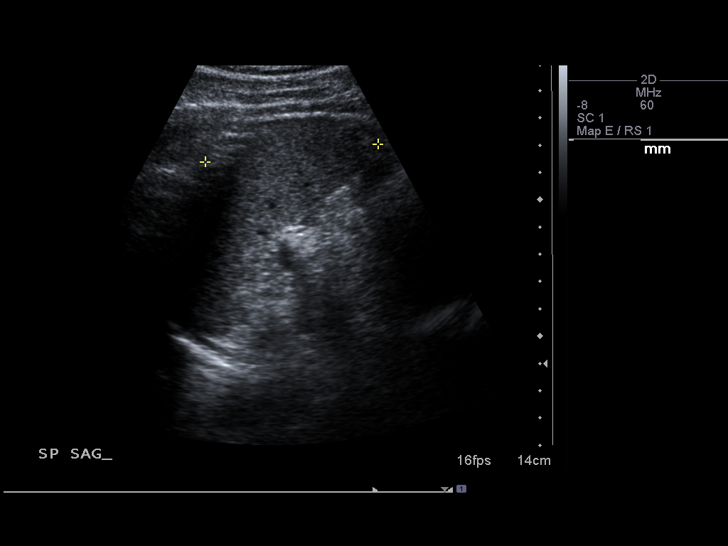
[im 67/73]
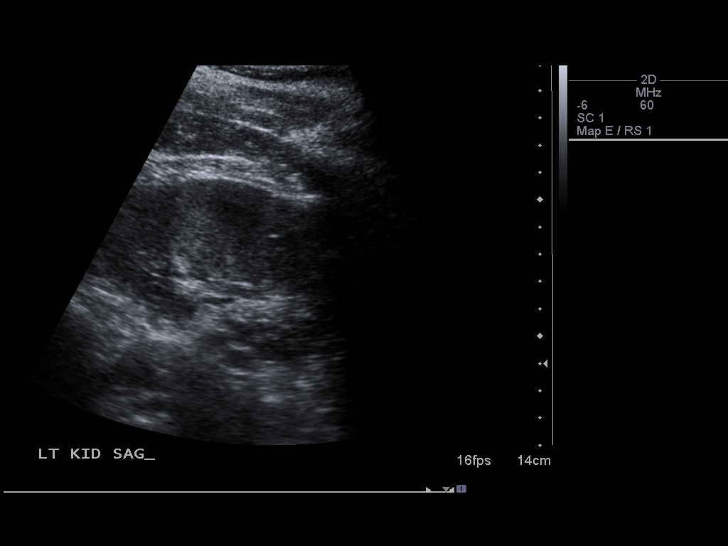
[im 73/73]
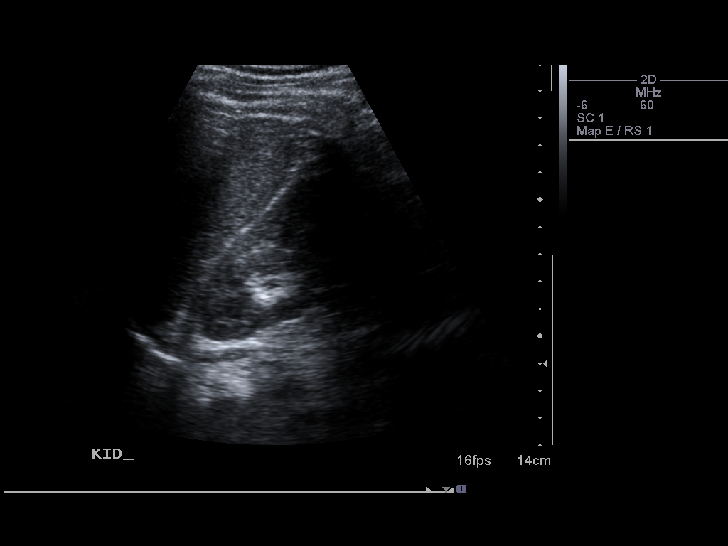

[13 of 25 positions shown; findings below may reference images not displayed]

FINDINGS: Gallbladder: No shadowing gallstones or echogenic sludge. No
gallbladder wall thickening or pericholecystic fluid. The
gallbladder wall thickness measured 2.2 mm. No sonographic Murphy's
sign according to the ultrasound technologist.

CBD: Normal in caliber measuring 3.6 mm. No choledocholithiasis is
evident.

Liver:  Normal size and echotexture without focal parenchymal
abnormality.

IVC:  Patent throughout its visualized course in the abdomen.

Pancreas:  Although the pancreas is difficult to visualize in its
entirety, no focal pancreatic abnormality is identified.  Tail was
partially obscured by overlying bowel gas.

Spleen:  Normal size and echotexture without focal abnormality.

Right kidney:  No hydronephrosis.  Well-preserved cortex.  Normal
parenchymal echotexture without focal abnormalities.  Right renal
length is 10.3 cm.

Left kidney:  No hydronephrosis.  Well-preserved cortex.  Normal
parenchymal echotexture without focal abnormalities.  Left renal
length is 10.8 cm.

Aorta:  Maximum diameter is 2.1 cm.  No aneurysm is evident.

Ascites:  None.
IMPRESSION: No abdominal pathology was demonstrated.

## 2013-01-06 ENCOUNTER — Encounter: Payer: Self-pay | Admitting: Family Medicine

## 2013-01-06 ENCOUNTER — Other Ambulatory Visit: Payer: Self-pay | Admitting: Gastroenterology

## 2013-01-06 ENCOUNTER — Ambulatory Visit (INDEPENDENT_AMBULATORY_CARE_PROVIDER_SITE_OTHER): Payer: BC Managed Care – PPO | Admitting: Family Medicine

## 2013-01-06 VITALS — BP 128/84 | HR 56 | Temp 98.6°F | Wt 158.4 lb

## 2013-01-06 DIAGNOSIS — H9190 Unspecified hearing loss, unspecified ear: Secondary | ICD-10-CM

## 2013-01-06 DIAGNOSIS — B1910 Unspecified viral hepatitis B without hepatic coma: Secondary | ICD-10-CM

## 2013-01-06 NOTE — Patient Instructions (Addendum)
It was nice to meet you today, Marcus Blair. We will refer you to Audiology and order a Carotid Ultrasound to make sure your arteries are normal. Please schedule follow up appointment with me as needed or in one year for next physical. If pulsating sounds become worse or louder, please return to clinic.  Preventive Care for Adults, Male A healthy lifestyle and preventive care can promote health and wellness. Preventive health guidelines for men include the following key practices:  A routine yearly physical is a good way to check with your caregiver about your health and preventative screening. It is a chance to share any concerns and updates on your health, and to receive a thorough exam.  Visit your dentist for a routine exam and preventative care every 6 months. Brush your teeth twice a day and floss once a day. Good oral hygiene prevents tooth decay and gum disease.  The frequency of eye exams is based on your age, health, family medical history, use of contact lenses, and other factors. Follow your caregiver's recommendations for frequency of eye exams.  Eat a healthy diet. Foods like vegetables, fruits, whole grains, low-fat dairy products, and lean protein foods contain the nutrients you need without too many calories. Decrease your intake of foods high in solid fats, added sugars, and salt. Eat the right amount of calories for you.Get information about a proper diet from your caregiver, if necessary.  Regular physical exercise is one of the most important things you can do for your health. Most adults should get at least 150 minutes of moderate-intensity exercise (any activity that increases your heart rate and causes you to sweat) each week. In addition, most adults need muscle-strengthening exercises on 2 or more days a week.  Maintain a healthy weight. The body mass index (BMI) is a screening tool to identify possible weight problems. It provides an estimate of body fat based on height and  weight. Your caregiver can help determine your BMI, and can help you achieve or maintain a healthy weight.For adults 20 years and older:  A BMI below 18.5 is considered underweight.  A BMI of 18.5 to 24.9 is normal.  A BMI of 25 to 29.9 is considered overweight.  A BMI of 30 and above is considered obese.  Maintain normal blood lipids and cholesterol levels by exercising and minimizing your intake of saturated fat. Eat a balanced diet with plenty of fruit and vegetables. Blood tests for lipids and cholesterol should begin at age 75 and be repeated every 5 years. If your lipid or cholesterol levels are high, you are over 50, or you are a high risk for heart disease, you may need your cholesterol levels checked more frequently.Ongoing high lipid and cholesterol levels should be treated with medicines if diet and exercise are not effective.  If you smoke, find out from your caregiver how to quit. If you do not use tobacco, do not start.  If you choose to drink alcohol, do not exceed 2 drinks per day. One drink is considered to be 12 ounces (355 mL) of beer, 5 ounces (148 mL) of wine, or 1.5 ounces (44 mL) of liquor.  Avoid use of street drugs. Do not share needles with anyone. Ask for help if you need support or instructions about stopping the use of drugs.  High blood pressure causes heart disease and increases the risk of stroke. Your blood pressure should be checked at least every 1 to 2 years. Ongoing high blood pressure should be  treated with medicines, if weight loss and exercise are not effective.  If you are 31 to 49 years old, ask your caregiver if you should take aspirin to prevent heart disease.  Diabetes screening involves taking a blood sample to check your fasting blood sugar level. This should be done once every 3 years, after age 26, if you are within normal weight and without risk factors for diabetes. Testing should be considered at a younger age or be carried out more  frequently if you are overweight and have at least 1 risk factor for diabetes.  Colorectal cancer can be detected and often prevented. Most routine colorectal cancer screening begins at the age of 65 and continues through age 71. However, your caregiver may recommend screening at an earlier age if you have risk factors for colon cancer. On a yearly basis, your caregiver may provide home test kits to check for hidden blood in the stool. Use of a small camera at the end of a tube, to directly examine the colon (sigmoidoscopy or colonoscopy), can detect the earliest forms of colorectal cancer. Talk to your caregiver about this at age 61, when routine screening begins. Direct examination of the colon should be repeated every 5 to 10 years through age 88, unless early forms of pre-cancerous polyps or small growths are found.  Hepatitis C blood testing is recommended for all people born from [birth year] through 1965 and any individual with known risks for hepatitis C.  Practice safe sex. Use condoms and avoid high-risk sexual practices to reduce the spread of sexually transmitted infections (STIs). STIs include gonorrhea, chlamydia, syphilis, trichomonas, herpes, HPV, and human immunodeficiency virus (HIV). Herpes, HIV, and HPV are viral illnesses that have no cure. They can result in disability, cancer, and death.  A one-time screening for abdominal aortic aneurysm (AAA) and surgical repair of large AAAs by sound wave imaging (ultrasonography) is recommended for ages 65 to 16 years who are current or former smokers.  Healthy men should no longer receive prostate-specific antigen (PSA) blood tests as part of routine cancer screening. Consult with your caregiver about prostate cancer screening.  Testicular cancer screening is not recommended for adult males who have no symptoms. Screening includes self-exam, caregiver exam, and other screening tests. Consult with your caregiver about any symptoms you have or any  concerns you have about testicular cancer.  Use sunscreen with skin protection factor (SPF) of 30 or more. Apply sunscreen liberally and repeatedly throughout the day. You should seek shade when your shadow is shorter than you. Protect yourself by wearing long sleeves, pants, a wide-brimmed hat, and sunglasses year round, whenever you are outdoors.  Once a month, do a whole body skin exam, using a mirror to look at the skin on your back. Notify your caregiver of new moles, moles that have irregular borders, moles that are larger than a pencil eraser, or moles that have changed in shape or color.  Stay current with required immunizations.  Influenza. You need a dose every fall (or winter). The composition of the flu vaccine changes each year, so being vaccinated once is not enough.  Pneumococcal polysaccharide. You need 1 to 2 doses if you smoke cigarettes or if you have certain chronic medical conditions. You need 1 dose at age 20 (or older) if you have never been vaccinated.  Tetanus, diphtheria, pertussis (Tdap, Td). Get 1 dose of Tdap vaccine if you are younger than age 43 years, are over 51 and have contact with an infant, are  a Research scientist (physical sciences), or simply want to be protected from whooping cough. After that, you need a Td booster dose every 10 years. Consult your caregiver if you have not had at least 3 tetanus and diphtheria-containing shots sometime in your life or have a deep or dirty wound.  HPV. This vaccine is recommended for males 13 through 49 years of age. This vaccine may be given to men 22 through 49 years of age who have not completed the 3 dose series. It is recommended for men through age 76 who have sex with men or whose immune system is weakened because of HIV infection, other illness, or medications. The vaccine is given in 3 doses over 6 months.  Measles, mumps, rubella (MMR). You need at least 1 dose of MMR if you were born in 1957 or later. You may also need a 2nd  dose.  Meningococcal. If you are age 37 to 90 years and a Orthoptist living in a residence hall, or have one of several medical conditions, you need to get vaccinated against meningococcal disease. You may also need additional booster doses.  Zoster (shingles). If you are age 53 years or older, you should get this vaccine.  Varicella (chickenpox). If you have never had chickenpox or you were vaccinated but received only 1 dose, talk to your caregiver to find out if you need this vaccine.  Hepatitis A. You need this vaccine if you have a specific risk factor for hepatitis A virus infection, or you simply wish to be protected from this disease. The vaccine is usually given as 2 doses, 6 to 18 months apart.  Hepatitis B. You need this vaccine if you have a specific risk factor for hepatitis B virus infection or you simply wish to be protected from this disease. The vaccine is given in 3 doses, usually over 6 months. Preventative Service / Frequency Ages 55 to 39  Blood pressure check.** / Every 1 to 2 years.  Lipid and cholesterol check.** / Every 5 years beginning at age 33.  Hepatitis C blood test.** / For any individual with known risks for hepatitis C.  Skin self-exam. / Monthly.  Influenza immunization.** / Every year.  Pneumococcal polysaccharide immunization.** / 1 to 2 doses if you smoke cigarettes or if you have certain chronic medical conditions.  Tetanus, diphtheria, pertussis (Tdap,Td) immunization. / A one-time dose of Tdap vaccine. After that, you need a Td booster dose every 10 years.  HPV immunization. / 3 doses over 6 months, if 26 and younger.  Measles, mumps, rubella (MMR) immunization. / You need at least 1 dose of MMR if you were born in 1957 or later. You may also need a 2nd dose.  Meningococcal immunization. / 1 dose if you are age 76 to 4 years and a Orthoptist living in a residence hall, or have one of several medical conditions,  you need to get vaccinated against meningococcal disease. You may also need additional booster doses.  Varicella immunization.** / Consult your caregiver.  Hepatitis A immunization.** / Consult your caregiver. 2 doses, 6 to 18 months apart.  Hepatitis B immunization.** / Consult your caregiver. 3 doses usually over 6 months. Ages 44 to 46  Blood pressure check.** / Every 1 to 2 years.  Lipid and cholesterol check.** / Every 5 years beginning at age 85.  Fecal occult blood test (FOBT) of stool. / Every year beginning at age 15 and continuing until age 34. You may not have to  do this test if you get colonoscopy every 10 years.  Flexible sigmoidoscopy** or colonoscopy.** / Every 5 years for a flexible sigmoidoscopy or every 10 years for a colonoscopy beginning at age 15 and continuing until age 22.  Hepatitis C blood test.** / For all people born from [birth year] through 1965 and any individual with known risks for hepatitis C.  Skin self-exam. / Monthly.  Influenza immunization.** / Every year.  Pneumococcal polysaccharide immunization.** / 1 to 2 doses if you smoke cigarettes or if you have certain chronic medical conditions.  Tetanus, diphtheria, pertussis (Tdap/Td) immunization.** / A one-time dose of Tdap vaccine. After that, you need a Td booster dose every 10 years.  Measles, mumps, rubella (MMR) immunization. / You need at least 1 dose of MMR if you were born in 1957 or later. You may also need a 2nd dose.  Varicella immunization.**/ Consult your caregiver.  Meningococcal immunization.** / Consult your caregiver.  Hepatitis A immunization.** / Consult your caregiver. 2 doses, 6 to 18 months apart.  Hepatitis B immunization.** / Consult your caregiver. 3 doses, usually over 6 months. Ages 34 and over  Blood pressure check.** / Every 1 to 2 years.  Lipid and cholesterol check.**/ Every 5 years beginning at age 65.  Fecal occult blood test (FOBT) of stool. / Every year beginning  at age 95 and continuing until age 83. You may not have to do this test if you get colonoscopy every 10 years.  Flexible sigmoidoscopy** or colonoscopy.** / Every 5 years for a flexible sigmoidoscopy or every 10 years for a colonoscopy beginning at age 30 and continuing until age 17.  Hepatitis C blood test.** / For all people born from [birth year] through 1965 and any individual with known risks for hepatitis C.  Abdominal aortic aneurysm (AAA) screening.** / A one-time screening for ages 85 to 86 years who are current or former smokers.  Skin self-exam. / Monthly.  Influenza immunization.** / Every year.  Pneumococcal polysaccharide immunization.** / 1 dose at age 55 (or older) if you have never been vaccinated.  Tetanus, diphtheria, pertussis (Tdap, Td) immunization. / A one-time dose of Tdap vaccine if you are over 65 and have contact with an infant, are a Research scientist (physical sciences), or simply want to be protected from whooping cough. After that, you need a Td booster dose every 10 years.  Varicella immunization. ** / Consult your caregiver.  Meningococcal immunization.** / Consult your caregiver.  Hepatitis A immunization. ** / Consult your caregiver. 2 doses, 6 to 18 months apart.  Hepatitis B immunization.** / Check with your caregiver. 3 doses, usually over 6 months. **Family history and personal history of risk and conditions may change your caregiver's recommendations. Document Released: 01/23/2002 Document Revised: 02/19/2012 Document Reviewed: 04/24/2011 [facility] Patient Information 2013 [place], [place].

## 2013-01-06 NOTE — Progress Notes (Signed)
  Subjective:    Patient ID: Marcus Blair, male    DOB: May 02, 1966, 47 y.o.   MRN: [id number]  HPI  Patient presents to clinic for annual physical exam.  Has a history Hepatitis B and is followed by Dr. Elnoria Howard.  Today, patient complains of hearing "pulsing sounds" when he lays down to sleep.  This has been going on for 6 years, but is getting worse in the last few months.  Noises are only in RT ear and he only notices it occur when everything around him is quiet.  Denies any associated dizziness or vertigo or headache.  He remembers seeing an ENT specialist in the past, but we have no records of this.  Of note, he says he had multiple ear infections in childhood and since then has had diminished hearing RT side.  I have reviewed and updated patient's PMH, SH, Surgical Hx, FH, Allergies, and Medications, and Problem List.   Review of Systems  Per HPI    Objective:   Physical Exam  Constitutional: He appears well-nourished. No distress.  HENT:  Head: Normocephalic and atraumatic.  Left Ear: External ear normal.       RT TM: intact; possible old perforation but has closed up  Neck: Normal range of motion. Neck supple.  Cardiovascular: Normal rate, regular rhythm and normal heart sounds.   Pulmonary/Chest: Effort normal and breath sounds normal. He has no wheezes.  Abdominal: Soft. Bowel sounds are normal. He exhibits no distension. There is no tenderness. There is no rebound and no guarding.  Musculoskeletal: Normal range of motion. He exhibits no edema.  Neurological: He is alert. No cranial nerve deficit. Coordination normal.       Assessment & Plan:

## 2013-01-07 ENCOUNTER — Encounter: Payer: Self-pay | Admitting: Family Medicine

## 2013-01-07 ENCOUNTER — Telehealth: Payer: Self-pay | Admitting: Family Medicine

## 2013-01-07 DIAGNOSIS — H9190 Unspecified hearing loss, unspecified ear: Secondary | ICD-10-CM | POA: Insufficient documentation

## 2013-01-07 NOTE — Assessment & Plan Note (Signed)
Patient endorses decreased hearing on RT and hearing "pulsing" noises RT ear, both chronic issues.  Will refer to audiology for hearing tests.  Will also order carotid doppler ultrasounds to rule out abnormality (dissection/bruit).  Return to clinic if symptoms worsen.  F/U in 3-4 months.

## 2013-01-07 NOTE — Telephone Encounter (Signed)
Spoke with patient and gave him the number to call to reschedule appointment

## 2013-01-07 NOTE — Telephone Encounter (Signed)
Patient is calling because he was told to call here to have the appt that is scheduled for 1/29 for the Doppler of his Carotid changed. He would like to speak to the nurse before the appt is rescheduled to discuss when he wants the appt to be.  Previous message was closed in error.

## 2013-01-07 NOTE — Telephone Encounter (Signed)
Patient is calling because he was told to call here to have the appt that is scheduled for 1/29 for the Doppler of his Carotid changed.  He would like to speak to the nurse before the appt is rescheduled to discuss when he wants the appt to be.

## 2013-01-07 NOTE — Assessment & Plan Note (Signed)
Followed by GI, Dr. Elnoria Howard.

## 2013-01-08 ENCOUNTER — Ambulatory Visit (HOSPITAL_COMMUNITY): Payer: BC Managed Care – PPO

## 2013-01-10 ENCOUNTER — Other Ambulatory Visit: Payer: BC Managed Care – PPO

## 2013-01-21 ENCOUNTER — Telehealth: Payer: Self-pay | Admitting: Family Medicine

## 2013-01-21 NOTE — Telephone Encounter (Signed)
Patient is calling because his insurance needs more information about why he needs the [place] on his neck.  They sent him a letter that he is going to fax over for Dr. Tye Savoy to see.  He thinks it may be best to cancel the appt for tomorrow until the insurance company gets what they need to approve for him to have it done.

## 2013-01-21 NOTE — Telephone Encounter (Signed)
Spoke with patient and informed him to keep appointment for tommorow. Auth# [id number]

## 2013-01-22 ENCOUNTER — Ambulatory Visit
Admission: RE | Admit: 2013-01-22 | Discharge: 2013-01-22 | Disposition: A | Discharge status: Home / Self Care | Payer: BC Managed Care – PPO | Source: Ambulatory Visit | Attending: Gastroenterology | Admitting: Gastroenterology

## 2013-01-22 ENCOUNTER — Ambulatory Visit (HOSPITAL_COMMUNITY)
Admission: RE | Admit: 2013-01-22 | Discharge: 2013-01-22 | Disposition: A | Discharge status: Home / Self Care | Payer: BC Managed Care – PPO | Source: Ambulatory Visit | Attending: Family Medicine | Admitting: Family Medicine

## 2013-01-22 DIAGNOSIS — B1910 Unspecified viral hepatitis B without hepatic coma: Secondary | ICD-10-CM

## 2013-01-22 DIAGNOSIS — R0989 Other specified symptoms and signs involving the circulatory and respiratory systems: Secondary | ICD-10-CM

## 2013-01-22 DIAGNOSIS — H9190 Unspecified hearing loss, unspecified ear: Secondary | ICD-10-CM | POA: Insufficient documentation

## 2013-01-22 NOTE — Progress Notes (Signed)
*  PRELIMINARY RESULTS* Vascular Ultrasound Carotid Duplex (Doppler) has been completed.  Preliminary findings: Bilateral:  No evidence of hemodynamically significant internal carotid artery stenosis.   Vertebral artery flow is antegrade.     Farrel Demark, RDMS, RVT  01/22/2013, 1:35 PM

## 2013-02-26 ENCOUNTER — Other Ambulatory Visit (HOSPITAL_COMMUNITY): Payer: Self-pay | Admitting: Gastroenterology

## 2013-02-26 DIAGNOSIS — B181 Chronic viral hepatitis B without delta-agent: Secondary | ICD-10-CM

## 2013-02-28 ENCOUNTER — Other Ambulatory Visit: Payer: Self-pay | Admitting: Radiology

## 2013-02-28 ENCOUNTER — Encounter (HOSPITAL_COMMUNITY): Payer: Self-pay | Admitting: Pharmacy Technician

## 2013-03-03 ENCOUNTER — Encounter (HOSPITAL_COMMUNITY): Payer: Self-pay

## 2013-03-03 ENCOUNTER — Ambulatory Visit (HOSPITAL_COMMUNITY)
Admission: RE | Admit: 2013-03-03 | Discharge: 2013-03-03 | Disposition: A | Discharge status: Home / Self Care | Payer: BC Managed Care – PPO | Source: Ambulatory Visit | Attending: Gastroenterology | Admitting: Gastroenterology

## 2013-03-03 DIAGNOSIS — B181 Chronic viral hepatitis B without delta-agent: Secondary | ICD-10-CM

## 2013-03-03 DIAGNOSIS — B1910 Unspecified viral hepatitis B without hepatic coma: Secondary | ICD-10-CM | POA: Insufficient documentation

## 2013-03-03 HISTORY — DX: Unspecified viral hepatitis B without hepatic coma: B19.10

## 2013-03-03 LAB — PROTIME-INR
INR: 0.9 (ref 0.00–1.49)
Prothrombin Time: 12.1 seconds (ref 11.6–15.2)

## 2013-03-03 LAB — CBC
Platelets: 199 10*3/uL (ref 150–400)
RBC: 5.27 MIL/uL (ref 4.22–5.81)
WBC: 4.5 10*3/uL (ref 4.0–10.5)

## 2013-03-03 MED ORDER — MIDAZOLAM HCL 2 MG/2ML IJ SOLN
INTRAMUSCULAR | Status: AC | PRN
  Administered 2013-03-03: 1 mg via INTRAVENOUS

## 2013-03-03 MED ORDER — SODIUM CHLORIDE 0.9 % IV SOLN: INTRAVENOUS | Status: DC

## 2013-03-03 MED ORDER — HYDROCODONE-ACETAMINOPHEN 5-325 MG PO TABS: 1.0000 | ORAL_TABLET | ORAL | Status: DC | PRN

## 2013-03-03 MED ORDER — FENTANYL CITRATE 0.05 MG/ML IJ SOLN
INTRAMUSCULAR | Status: AC | PRN
  Administered 2013-03-03: 50 ug via INTRAVENOUS

## 2013-03-03 MED ORDER — FENTANYL CITRATE 0.05 MG/ML IJ SOLN
INTRAMUSCULAR | Status: AC
  Filled 2013-03-03: qty 4

## 2013-03-03 MED ORDER — MIDAZOLAM HCL 2 MG/2ML IJ SOLN
INTRAMUSCULAR | Status: AC
  Filled 2013-03-03: qty 4

## 2013-03-03 NOTE — Procedures (Signed)
US liver core bx 18g x3 No complication No blood loss. See complete dictation in [facility] PACS.  

## 2013-03-03 NOTE — H&P (Signed)
Marcus Blair is an 47 y.o. male.   Chief Complaint: Hep B diagnosed 1999 Unknown cause Was referred to Dr Elnoria Howard for evaluation Scheduled now for liver core biopsy HPI: Hep B  Past Medical History  Diagnosis Date  . Hepatitis B 1999    [place]  . Hep B w/o coma     Past Surgical History  Procedure Laterality Date  . Hernia repair  2003    [place]  . Hernia repair  2003    Family History  Problem Relation Age of Onset  . Early death Mother   . Early death Brother    Social History:  reports that he has never smoked. He has never used smokeless tobacco. He reports that he does not drink alcohol or use illicit drugs.  Allergies: No Known Allergies   (Not in a hospital admission)  Results for orders placed during the hospital encounter of 03/03/13 (from the past 48 hour(s))  APTT     Status: None   Collection Time    03/03/13 10:07 AM      Result Value Range   aPTT 31  24 - 37 seconds  CBC     Status: None   Collection Time    03/03/13 10:07 AM      Result Value Range   WBC 4.5  4.0 - 10.5 K/uL   RBC 5.27  4.22 - 5.81 MIL/uL   Hemoglobin 15.9  13.0 - 17.0 g/dL   HCT 16.1  09.6 - 04.5 %   MCV 84.4  78.0 - 100.0 fL   MCH 30.2  26.0 - 34.0 pg   MCHC 35.7  30.0 - 36.0 g/dL   RDW 40.9  81.1 - 91.4 %   Platelets 199  150 - 400 K/uL  PROTIME-INR     Status: None   Collection Time    03/03/13 10:07 AM      Result Value Range   Prothrombin Time 12.1  11.6 - 15.2 seconds   INR 0.90  0.00 - 1.49   No results found.  Review of Systems  Constitutional: Negative for fever and weight loss.  Respiratory: Negative for shortness of breath.   Cardiovascular: Negative for chest pain.  Gastrointestinal: Negative for vomiting and abdominal pain.  Neurological: Negative for weakness.    Blood pressure 141/93, pulse 62, temperature 97.9 F (36.6 C), temperature source Oral, resp. rate 20, height 5\' 8"  (1.727 m), weight 158 lb (71.668 kg), SpO2 100.00%. Physical  Exam  Constitutional: He is oriented to person, place, and time. He appears well-developed and well-nourished.  Cardiovascular: Normal rate, regular rhythm and normal heart sounds.   No murmur heard. Respiratory: Effort normal and breath sounds normal. He has no wheezes.  GI: Soft. Bowel sounds are normal. There is no tenderness.  Musculoskeletal: Normal range of motion.  Neurological: He is alert and oriented to person, place, and time.  Psychiatric: He has a normal mood and affect. His behavior is normal. Judgment and thought content normal.     Assessment/Plan Hep B Scheduled for liver core bx Pt aware of procedure benefits and risks and agreeable to proceed Consent signed and in chart  Donalee Gaumond A 03/03/2013, 10:42 AM

## 2014-05-18 ENCOUNTER — Other Ambulatory Visit: Payer: Self-pay | Admitting: Gastroenterology

## 2014-05-18 DIAGNOSIS — B181 Chronic viral hepatitis B without delta-agent: Secondary | ICD-10-CM

## 2014-06-01 ENCOUNTER — Ambulatory Visit
Admission: RE | Admit: 2014-06-01 | Discharge: 2014-06-01 | Disposition: A | Discharge status: Home / Self Care | Payer: 59 | Source: Ambulatory Visit | Attending: Gastroenterology | Admitting: Gastroenterology

## 2014-06-01 DIAGNOSIS — B181 Chronic viral hepatitis B without delta-agent: Secondary | ICD-10-CM

## 2014-07-20 IMAGING — US US ABDOMEN COMPLETE
1 series · 14 of 25 positions shown · non-contrast
Comparison: 01/22/2013.

CLINICAL DATA: Hepatitis-B,  yearly check-up

EXAM:
ULTRASOUND ABDOMEN COMPLETE

[Series 1: us abdomen complete · 0.24mm/px · 14 of 84 slices shown]
[im 1/84]
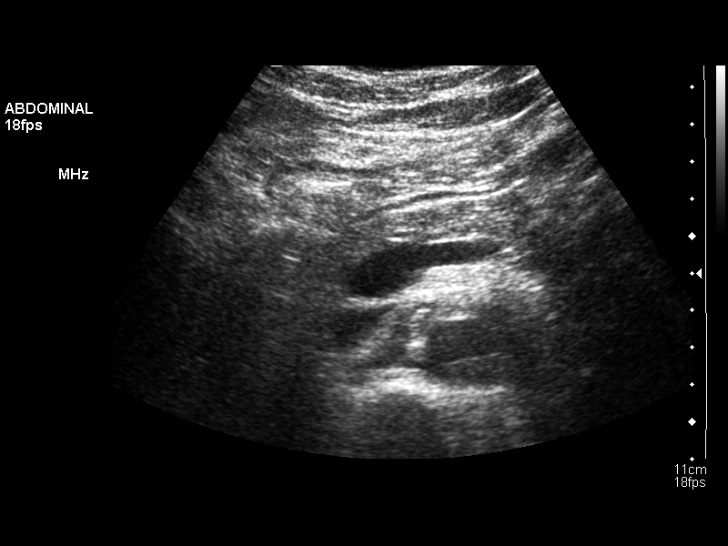
[im 7/84]
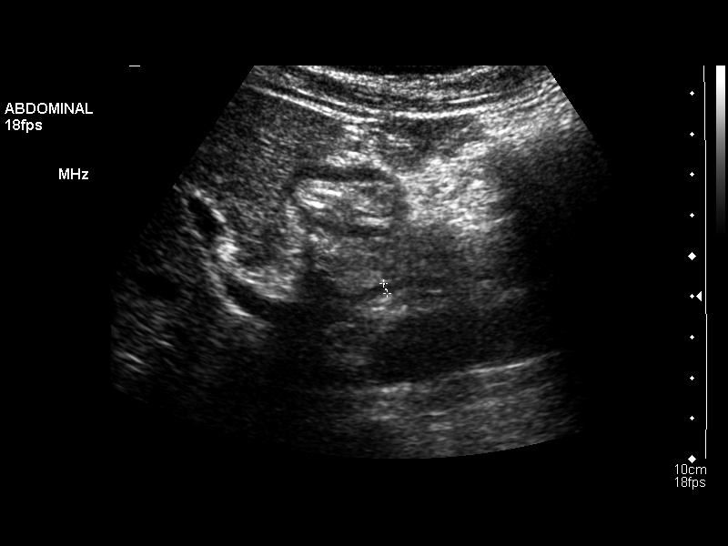
[im 14/84]
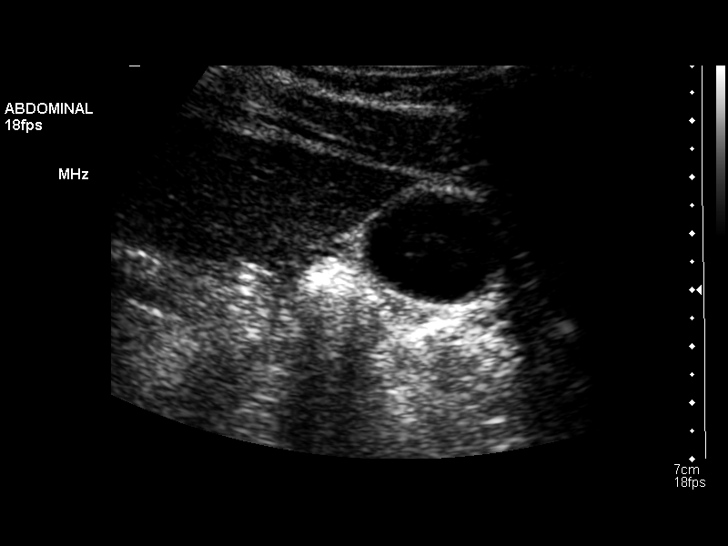
[im 21/84]
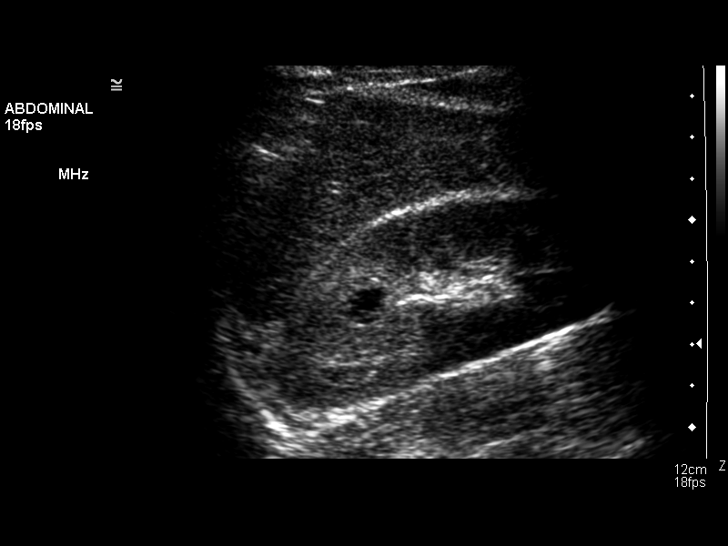
[im 28/84]
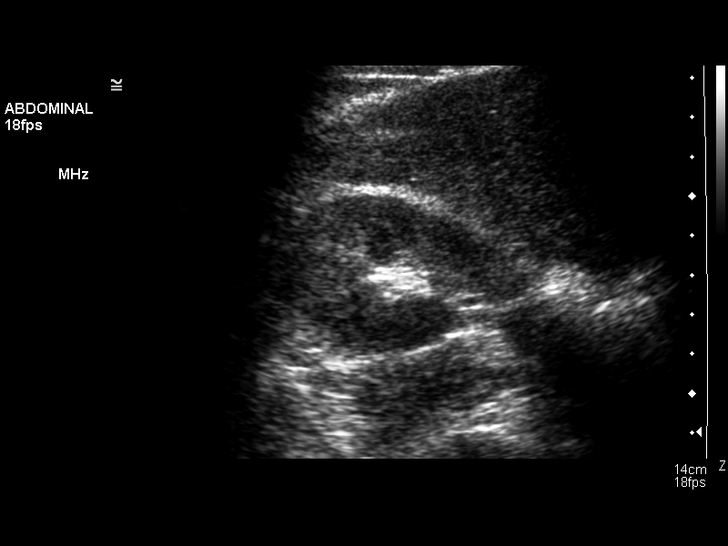
[im 32/84]
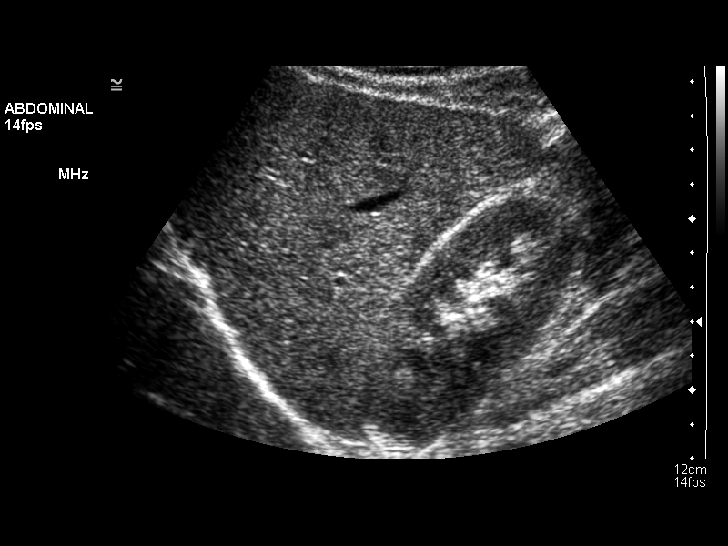
[im 39/84]
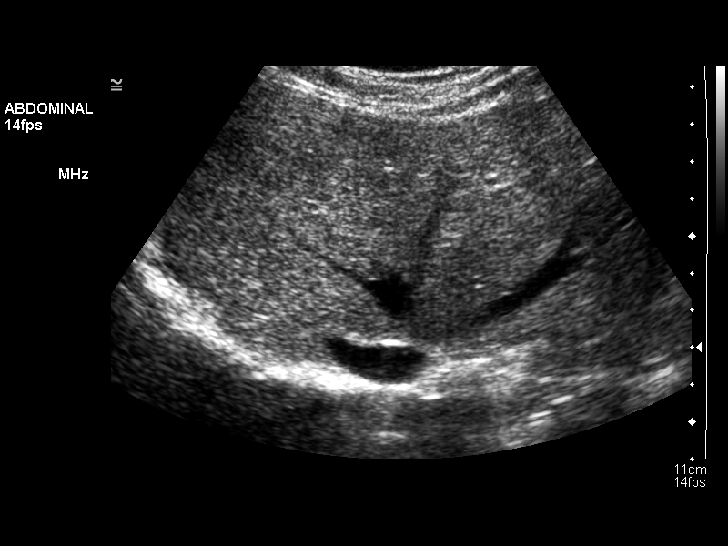
[im 45/84]
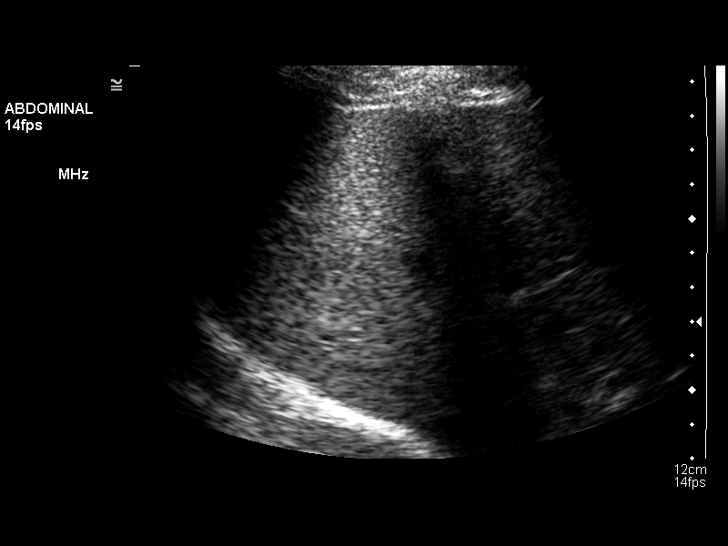
[im 52/84]
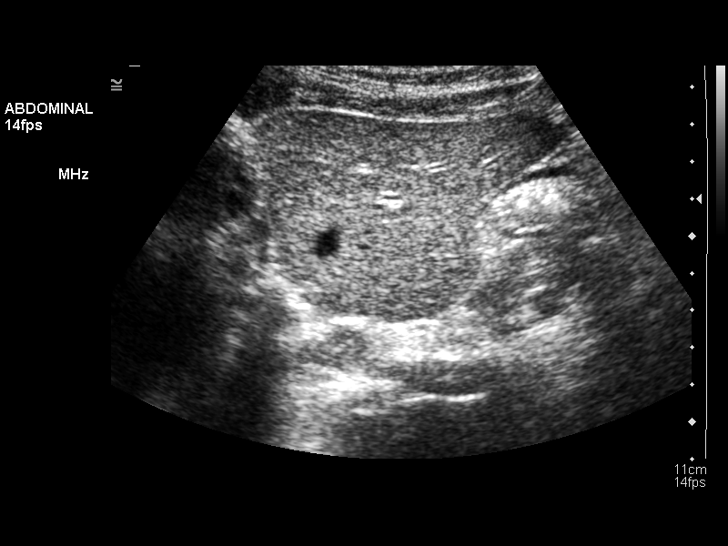
[im 56/84]
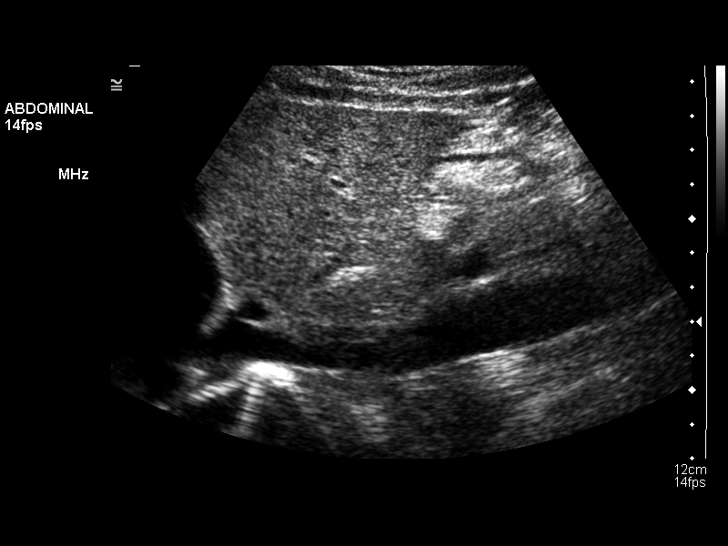
[im 63/84]
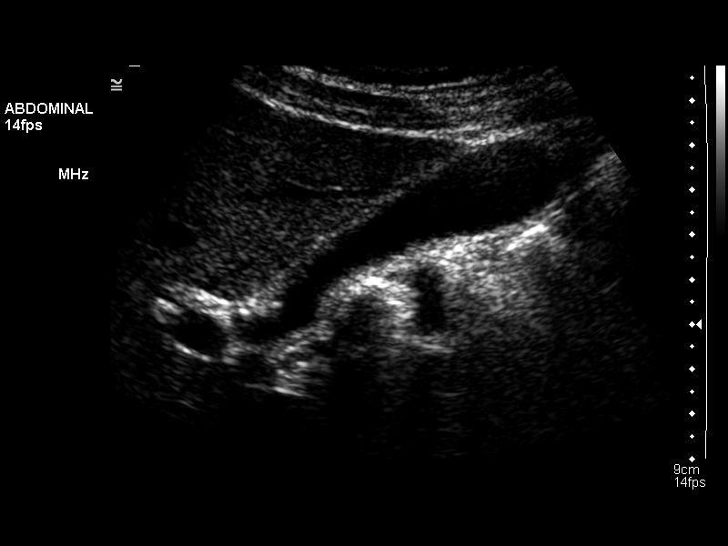
[im 70/84]
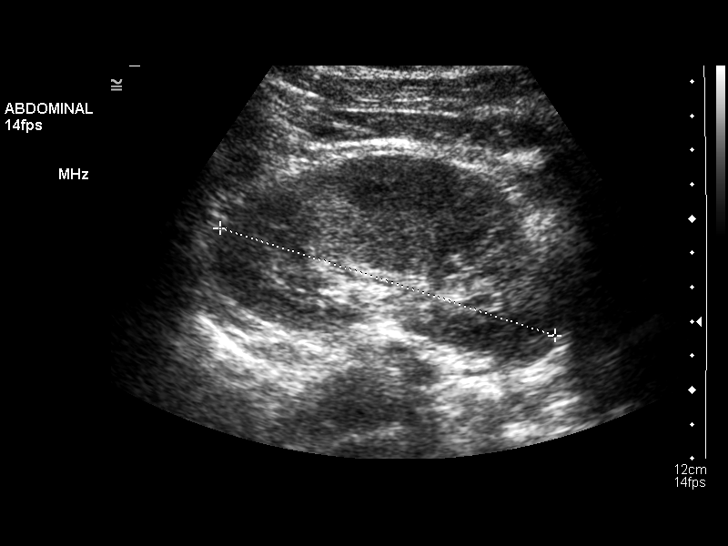
[im 77/84]
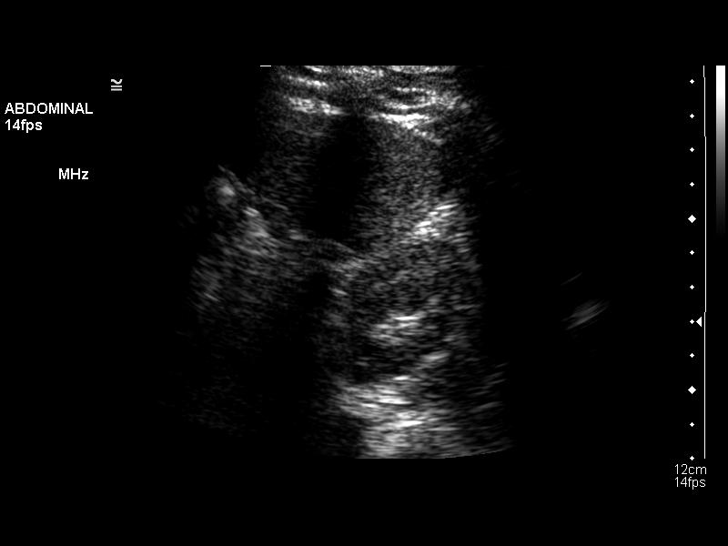
[im 84/84]
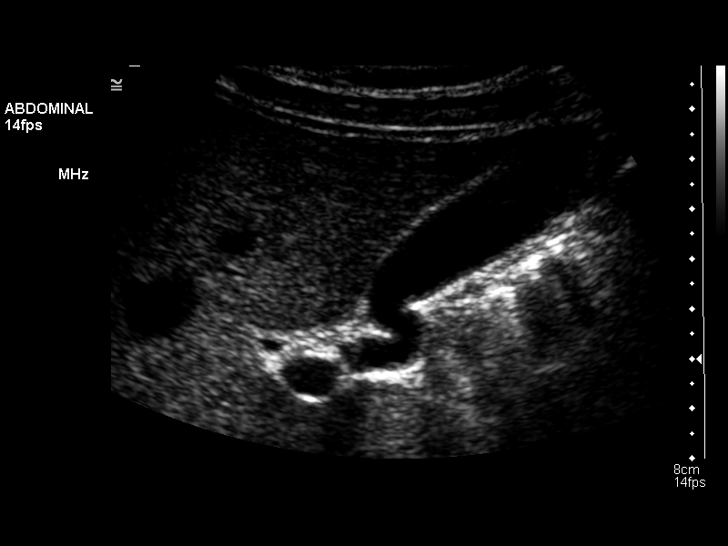

[14 of 25 positions shown; findings below may reference images not displayed]

FINDINGS: Gallbladder:

No gallstones or wall thickening visualized. No sonographic Murphy
sign noted.

Common bile duct:

Diameter: 3.6 mm proximally.  Distal CBD measures 2.6 mm.

Liver:

No focal lesion identified. Within normal limits in parenchymal
echogenicity. No intrahepatic biliary ductal dilatation.

IVC:

No abnormality visualized.

Pancreas:

Visualized portion unremarkable.

Spleen:

Size and appearance within normal limits.  Measures 5.5 cm in length

Right Kidney:

Length: 9.4 cm. No hydronephrosis or diagnostic renal calculus.
There is a cyst with a small septation measures 1 by 1 cm. Stable
from prior exam.

Left Kidney:

Length: 10.4 cm. Echogenicity within normal limits. No mass or
hydronephrosis visualized.

Abdominal aorta:

No aneurysm visualized.  Measures up to 2.1 cm in diameter.

Other findings:

None.
IMPRESSION: 1. No gallstones are noted within gallbladder.  Normal CBD.
2. No focal hepatic mass.
3. Stable right renal cyst.  No hydronephrosis.

## 2015-09-15 ENCOUNTER — Other Ambulatory Visit: Payer: Self-pay | Admitting: Gastroenterology

## 2015-09-15 DIAGNOSIS — B181 Chronic viral hepatitis B without delta-agent: Secondary | ICD-10-CM

## 2015-09-23 ENCOUNTER — Other Ambulatory Visit: Payer: Self-pay

## 2015-09-27 ENCOUNTER — Ambulatory Visit
Admission: RE | Admit: 2015-09-27 | Discharge: 2015-09-27 | Disposition: A | Discharge status: Home / Self Care | Payer: 59 | Source: Ambulatory Visit | Attending: Gastroenterology | Admitting: Gastroenterology

## 2015-09-27 ENCOUNTER — Other Ambulatory Visit: Payer: Self-pay

## 2015-09-27 DIAGNOSIS — B181 Chronic viral hepatitis B without delta-agent: Secondary | ICD-10-CM

## 2015-11-15 IMAGING — US US ABDOMEN LIMITED
1 series · 14 of 25 positions shown · non-contrast
Comparison: 06/01/2014

CLINICAL DATA: Hepatitis-B carrier

EXAM:
US ABDOMEN LIMITED - RIGHT UPPER QUADRANT

[Series 1: us abdomen limited · 0.22mm/px · 14 of 35 slices shown]
[im 1/35]
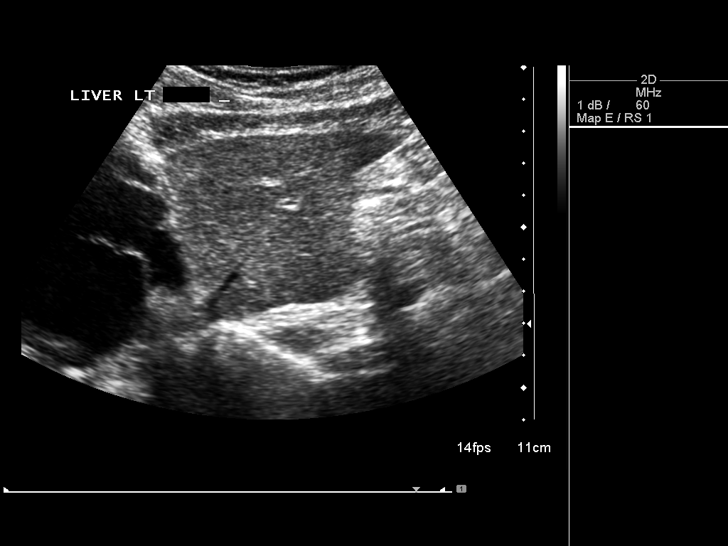
[im 3/35]
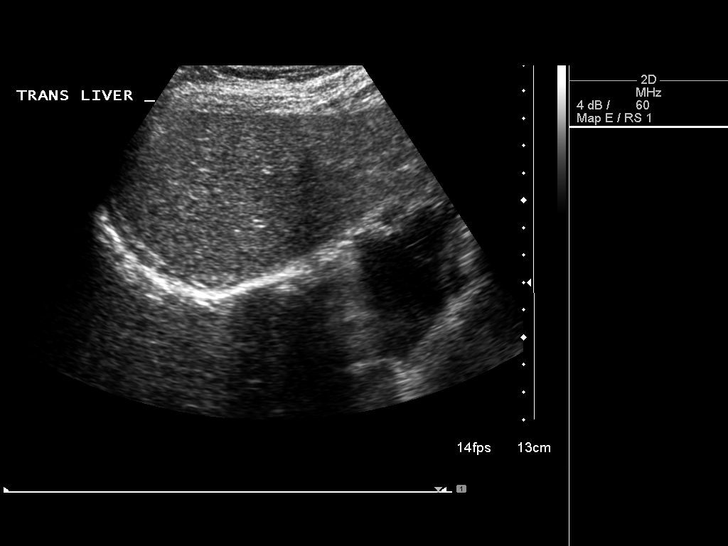
[im 6/35]
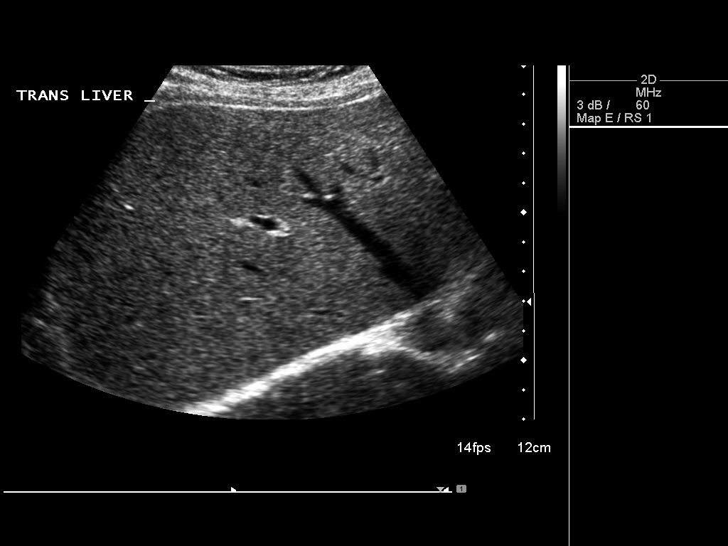
[im 9/35]
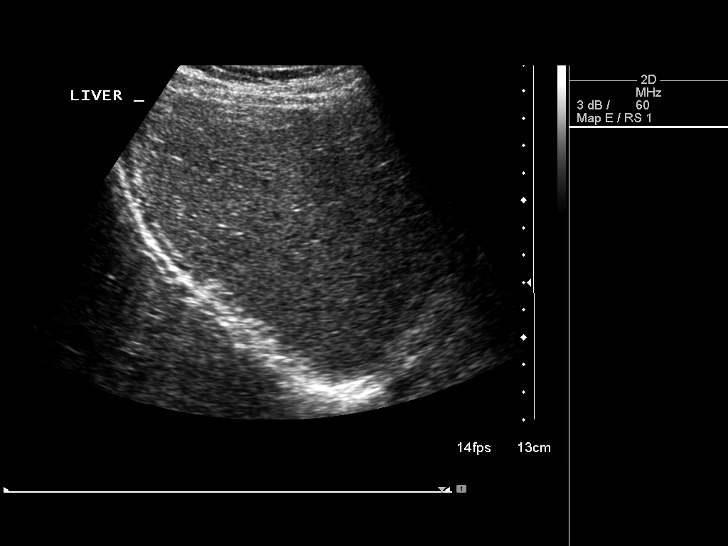
[im 12/35]
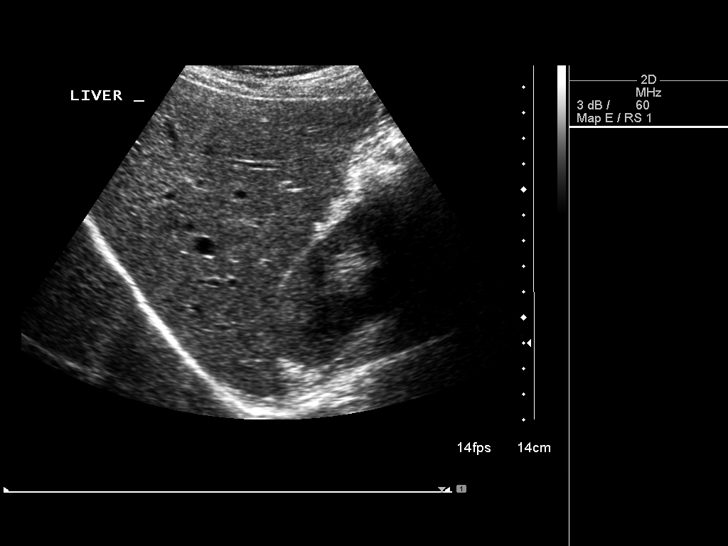
[im 13/35]
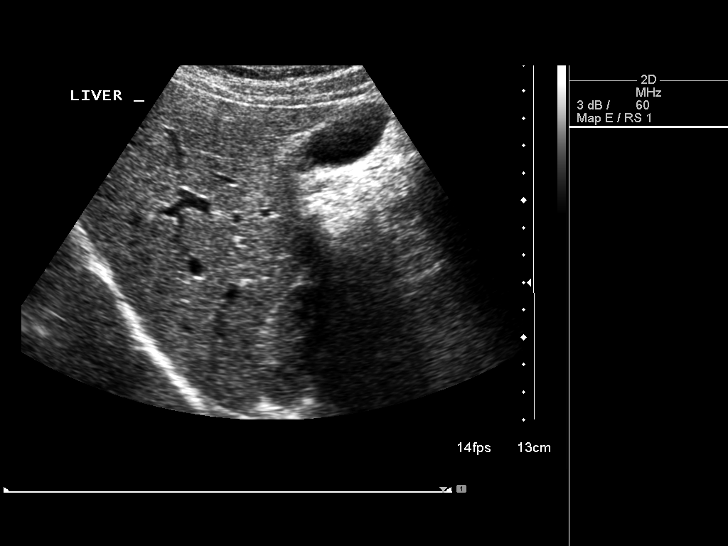
[im 16/35]
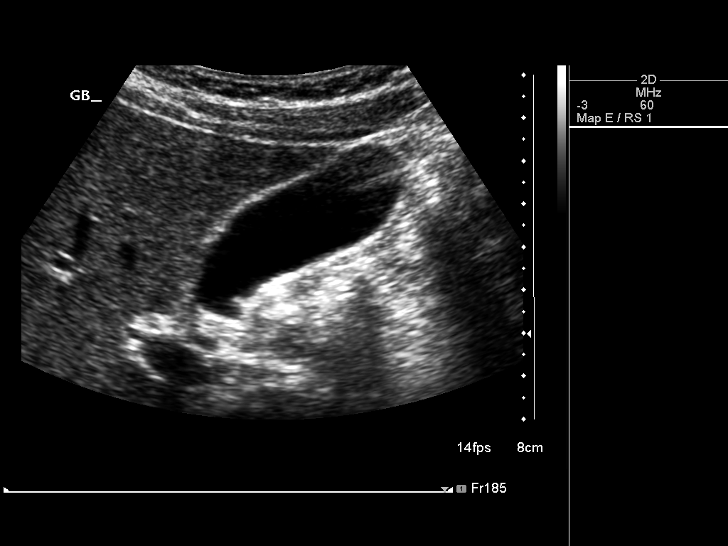
[im 19/35]
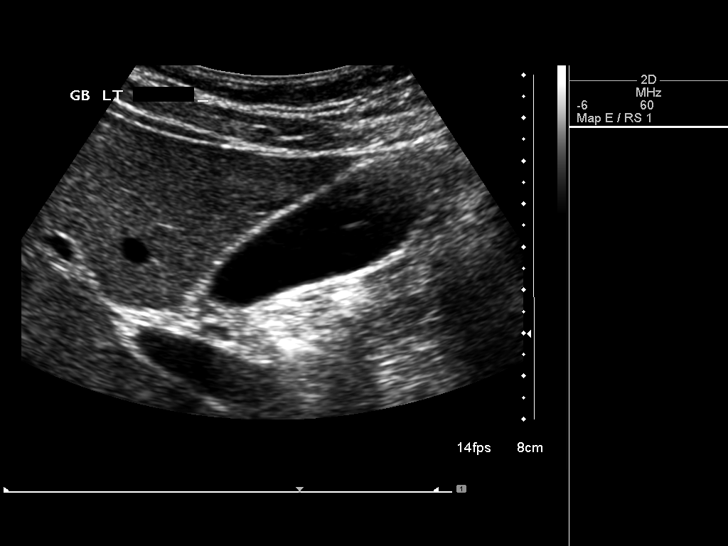
[im 22/35]
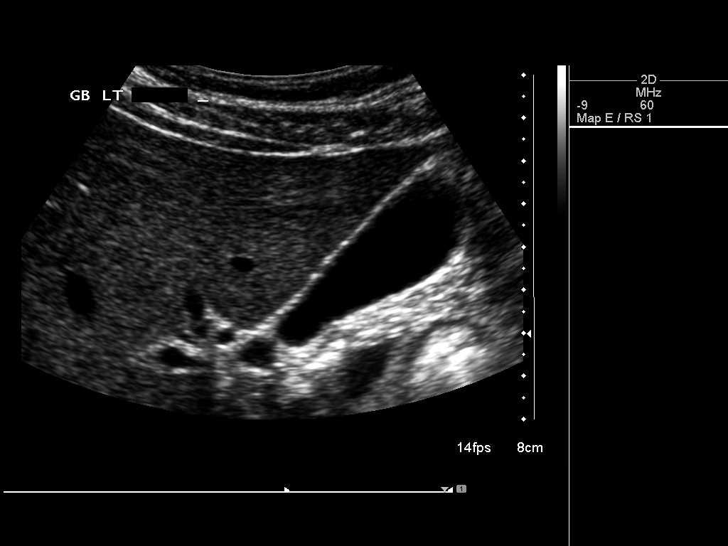
[im 23/35]
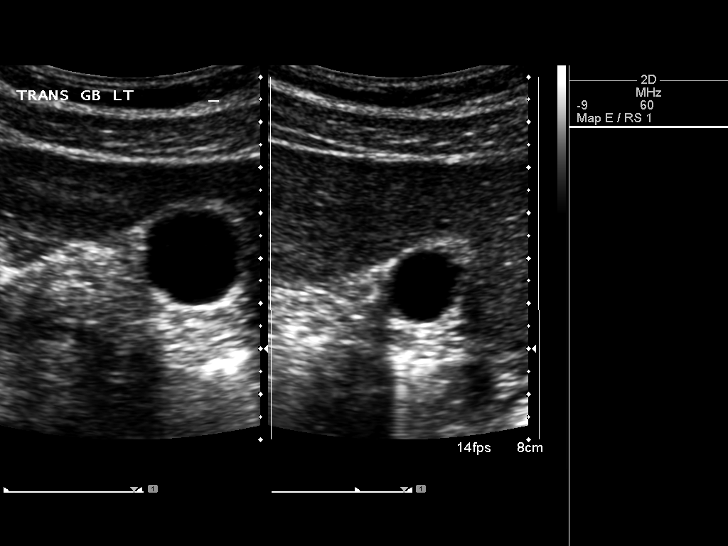
[im 26/35]
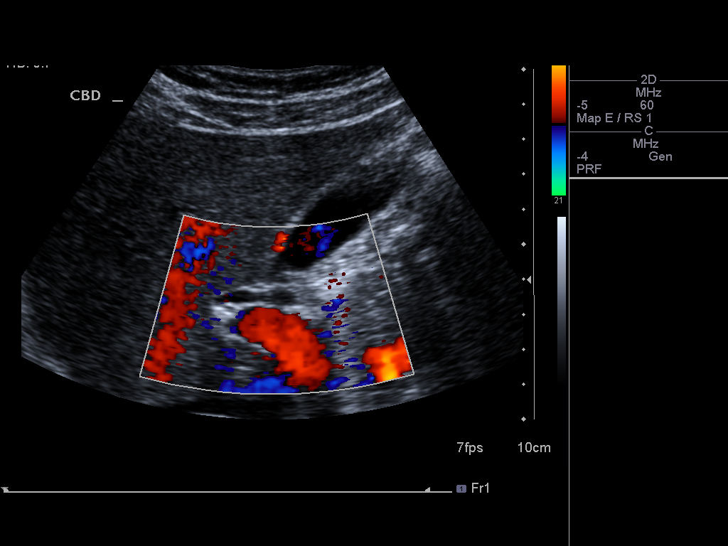
[im 29/35]
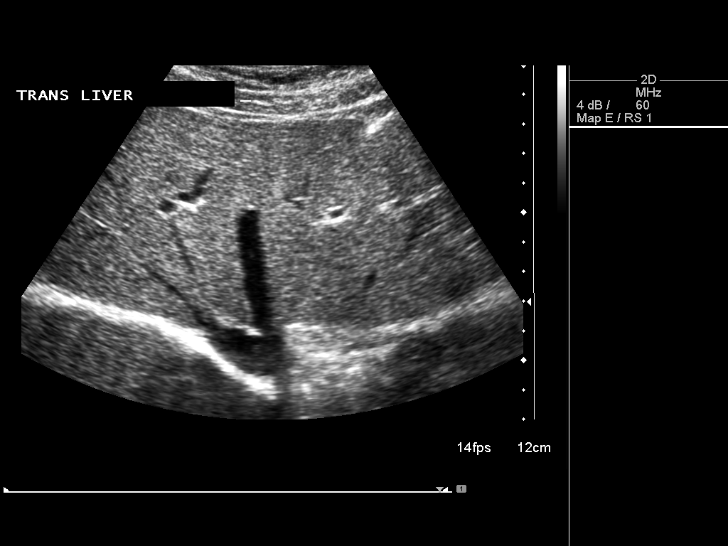
[im 32/35]
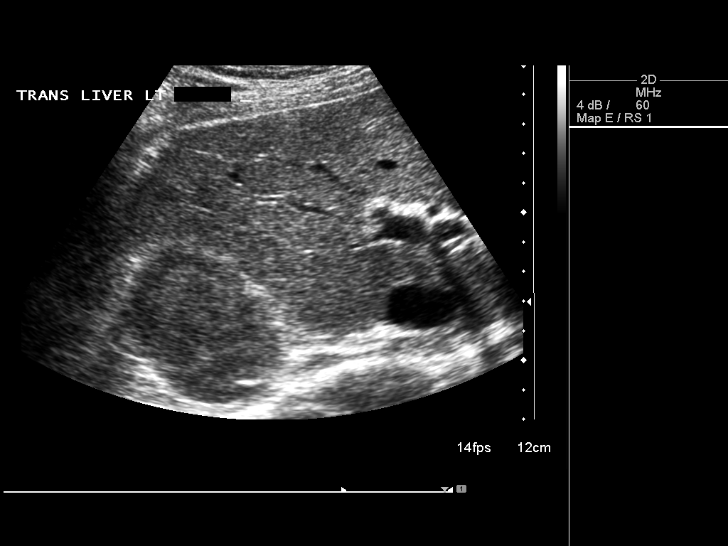
[im 35/35]
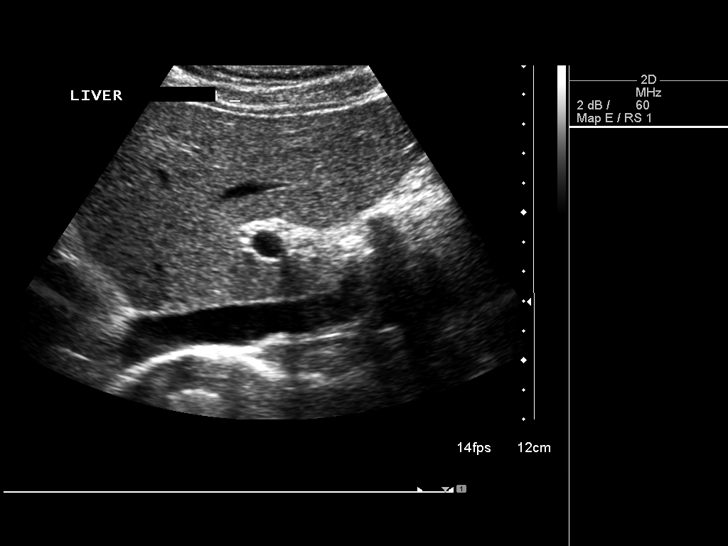

[14 of 25 positions shown; findings below may reference images not displayed]

FINDINGS: Gallbladder:

No gallstones or wall thickening visualized. No sonographic Murphy
sign noted.

Common bile duct:

Diameter: 3.4 mm

Liver:

No focal lesion identified. Within normal limits in parenchymal
echogenicity.
IMPRESSION: Normal exam.
